# Patient Record
Sex: Female | Born: 1965 | Race: Asian | Hispanic: No | Marital: Married | Smoking: Never smoker
Health system: Southern US, Community
[De-identification: ages and names within clinical notes are randomized; demographics above are authoritative.]

## PROBLEM LIST (undated history)

## (undated) DIAGNOSIS — E785 Hyperlipidemia, unspecified: Secondary | ICD-10-CM

## (undated) DIAGNOSIS — G4730 Sleep apnea, unspecified: Secondary | ICD-10-CM

## (undated) DIAGNOSIS — K219 Gastro-esophageal reflux disease without esophagitis: Secondary | ICD-10-CM

## (undated) HISTORY — DX: Gastro-esophageal reflux disease without esophagitis: K21.9

## (undated) HISTORY — DX: Hyperlipidemia, unspecified: E78.5

## (undated) HISTORY — DX: Sleep apnea, unspecified: G47.30

---

## 2015-06-06 DIAGNOSIS — R0789 Other chest pain: Secondary | ICD-10-CM

## 2015-06-06 DIAGNOSIS — M545 Low back pain, unspecified: Secondary | ICD-10-CM | POA: Insufficient documentation

## 2015-06-06 DIAGNOSIS — E7800 Pure hypercholesterolemia, unspecified: Secondary | ICD-10-CM

## 2015-06-06 DIAGNOSIS — R635 Abnormal weight gain: Secondary | ICD-10-CM | POA: Insufficient documentation

## 2015-06-06 HISTORY — DX: Other chest pain: R07.89

## 2015-06-06 HISTORY — DX: Pure hypercholesterolemia, unspecified: E78.00

## 2015-06-06 HISTORY — DX: Low back pain, unspecified: M54.50

## 2015-06-06 HISTORY — DX: Abnormal weight gain: R63.5

## 2015-09-23 DIAGNOSIS — I10 Essential (primary) hypertension: Secondary | ICD-10-CM | POA: Insufficient documentation

## 2015-09-23 HISTORY — DX: Essential (primary) hypertension: I10

## 2017-05-28 DIAGNOSIS — I10 Essential (primary) hypertension: Secondary | ICD-10-CM

## 2017-05-28 HISTORY — DX: Essential (primary) hypertension: I10

## 2017-09-22 HISTORY — PX: TOTAL VAGINAL HYSTERECTOMY: SHX2548

## 2017-10-27 DIAGNOSIS — N83202 Unspecified ovarian cyst, left side: Secondary | ICD-10-CM

## 2017-10-27 DIAGNOSIS — Z9889 Other specified postprocedural states: Secondary | ICD-10-CM

## 2017-10-27 DIAGNOSIS — K635 Polyp of colon: Secondary | ICD-10-CM

## 2017-10-27 DIAGNOSIS — N83201 Unspecified ovarian cyst, right side: Secondary | ICD-10-CM

## 2017-10-27 DIAGNOSIS — G43909 Migraine, unspecified, not intractable, without status migrainosus: Secondary | ICD-10-CM | POA: Insufficient documentation

## 2017-10-27 HISTORY — DX: Migraine, unspecified, not intractable, without status migrainosus: G43.909

## 2017-10-27 HISTORY — DX: Polyp of colon: K63.5

## 2017-10-27 HISTORY — DX: Unspecified ovarian cyst, left side: N83.202

## 2017-10-27 HISTORY — DX: Other specified postprocedural states: Z98.890

## 2017-10-27 HISTORY — DX: Unspecified ovarian cyst, right side: N83.201

## 2017-12-08 LAB — HM COLONOSCOPY

## 2018-04-30 DIAGNOSIS — M25561 Pain in right knee: Secondary | ICD-10-CM | POA: Insufficient documentation

## 2018-04-30 HISTORY — DX: Pain in right knee: M25.561

## 2019-07-01 DIAGNOSIS — R7303 Prediabetes: Secondary | ICD-10-CM | POA: Insufficient documentation

## 2019-07-01 HISTORY — DX: Prediabetes: R73.03

## 2019-10-28 DIAGNOSIS — M542 Cervicalgia: Secondary | ICD-10-CM | POA: Insufficient documentation

## 2019-10-28 DIAGNOSIS — M180 Bilateral primary osteoarthritis of first carpometacarpal joints: Secondary | ICD-10-CM

## 2019-10-28 DIAGNOSIS — G5603 Carpal tunnel syndrome, bilateral upper limbs: Secondary | ICD-10-CM

## 2019-10-28 DIAGNOSIS — R200 Anesthesia of skin: Secondary | ICD-10-CM

## 2019-10-28 DIAGNOSIS — M65332 Trigger finger, left middle finger: Secondary | ICD-10-CM

## 2019-10-28 HISTORY — DX: Anesthesia of skin: R20.0

## 2019-10-28 HISTORY — DX: Trigger finger, left middle finger: M65.332

## 2019-10-28 HISTORY — DX: Cervicalgia: M54.2

## 2019-10-28 HISTORY — DX: Bilateral primary osteoarthritis of first carpometacarpal joints: M18.0

## 2019-10-28 HISTORY — DX: Carpal tunnel syndrome, bilateral upper limbs: G56.03

## 2019-11-26 ENCOUNTER — Ambulatory Visit: Payer: BLUE CROSS/BLUE SHIELD | Attending: Internal Medicine

## 2019-11-26 ENCOUNTER — Other Ambulatory Visit: Payer: Self-pay

## 2019-11-26 DIAGNOSIS — Z23 Encounter for immunization: Secondary | ICD-10-CM | POA: Insufficient documentation

## 2019-11-26 NOTE — Progress Notes (Signed)
   Covid-19 Vaccination Clinic  Name:  Warrene Kapfer    MRN: [id number] DOB: April 03, 1966  11/26/2019  Ms. Morine was observed post Covid-19 immunization for 15 minutes without incident. She was provided with Vaccine Information Sheet and instruction to access the V-Safe system.   Ms. Caserta was instructed to call 911 with any severe reactions post vaccine: Marland Kitchen Difficulty breathing  . Swelling of face and throat  . A fast heartbeat  . A bad rash all over body  . Dizziness and weakness   Immunizations Administered    Name Date Dose VIS Date Route   Moderna COVID-19 Vaccine 11/26/2019  4:08 PM 0.5 mL 08/23/2019 Intramuscular   Manufacturer: Moderna   Lot: [id number]   NDC: 98921-194-17

## 2019-12-24 ENCOUNTER — Ambulatory Visit: Payer: BLUE CROSS/BLUE SHIELD

## 2019-12-27 ENCOUNTER — Ambulatory Visit: Payer: BLUE CROSS/BLUE SHIELD | Attending: Family

## 2019-12-27 DIAGNOSIS — Z23 Encounter for immunization: Secondary | ICD-10-CM

## 2019-12-27 NOTE — Progress Notes (Signed)
   Covid-19 Vaccination Clinic  Name:  Sherri Wolf    MRN: [id number] DOB: Oct 23, 1965  12/27/2019  [name] was observed post Covid-19 immunization for 15 minutes without incident. She was provided with Vaccine Information Sheet and instruction to access the V-Safe system.   [name] was instructed to call 911 with any severe reactions post vaccine: Marland Kitchen Difficulty breathing  . Swelling of face and throat  . A fast heartbeat  . A bad rash all over body  . Dizziness and weakness   Immunizations Administered    Name Date Dose VIS Date Route   Moderna COVID-19 Vaccine 12/27/2019  4:11 PM 0.5 mL 08/23/2019 Intramuscular   Manufacturer: Moderna   Lot: [id number]   NDC: 38184-037-54

## 2019-12-30 DIAGNOSIS — E559 Vitamin D deficiency, unspecified: Secondary | ICD-10-CM | POA: Insufficient documentation

## 2019-12-30 HISTORY — DX: Vitamin D deficiency, unspecified: E55.9

## 2020-03-02 DIAGNOSIS — R053 Chronic cough: Secondary | ICD-10-CM

## 2020-03-02 HISTORY — DX: Chronic cough: R05.3

## 2020-09-08 DIAGNOSIS — J22 Unspecified acute lower respiratory infection: Secondary | ICD-10-CM

## 2020-09-08 DIAGNOSIS — J0100 Acute maxillary sinusitis, unspecified: Secondary | ICD-10-CM

## 2020-09-08 DIAGNOSIS — Z20822 Contact with and (suspected) exposure to covid-19: Secondary | ICD-10-CM | POA: Insufficient documentation

## 2020-09-08 HISTORY — DX: Acute maxillary sinusitis, unspecified: J01.00

## 2020-09-08 HISTORY — DX: Contact with and (suspected) exposure to covid-19: Z20.822

## 2020-09-08 HISTORY — DX: Unspecified acute lower respiratory infection: J22

## 2020-12-28 LAB — HM MAMMOGRAPHY

## 2021-01-07 DIAGNOSIS — R600 Localized edema: Secondary | ICD-10-CM

## 2021-01-07 DIAGNOSIS — R609 Edema, unspecified: Secondary | ICD-10-CM

## 2021-01-07 HISTORY — DX: Localized edema: R60.0

## 2021-12-04 ENCOUNTER — Encounter: Payer: Self-pay | Admitting: Family Medicine

## 2021-12-04 ENCOUNTER — Other Ambulatory Visit: Payer: Self-pay

## 2021-12-04 ENCOUNTER — Ambulatory Visit (INDEPENDENT_AMBULATORY_CARE_PROVIDER_SITE_OTHER): Payer: 59 | Admitting: Family Medicine

## 2021-12-04 VITALS — BP 114/78 | HR 83 | Temp 97.8°F | Ht 64.0 in | Wt 178.0 lb

## 2021-12-04 DIAGNOSIS — L299 Pruritus, unspecified: Secondary | ICD-10-CM

## 2021-12-04 DIAGNOSIS — E785 Hyperlipidemia, unspecified: Secondary | ICD-10-CM | POA: Insufficient documentation

## 2021-12-04 DIAGNOSIS — K219 Gastro-esophageal reflux disease without esophagitis: Secondary | ICD-10-CM | POA: Insufficient documentation

## 2021-12-04 DIAGNOSIS — E7800 Pure hypercholesterolemia, unspecified: Secondary | ICD-10-CM

## 2021-12-04 DIAGNOSIS — R600 Localized edema: Secondary | ICD-10-CM

## 2021-12-04 DIAGNOSIS — G4733 Obstructive sleep apnea (adult) (pediatric): Secondary | ICD-10-CM

## 2021-12-04 DIAGNOSIS — M797 Fibromyalgia: Secondary | ICD-10-CM

## 2021-12-04 HISTORY — DX: Localized edema: R60.0

## 2021-12-04 HISTORY — DX: Gastro-esophageal reflux disease without esophagitis: K21.9

## 2021-12-04 HISTORY — DX: Obstructive sleep apnea (adult) (pediatric): G47.33

## 2021-12-04 HISTORY — DX: Pruritus, unspecified: L29.9

## 2021-12-04 HISTORY — DX: Fibromyalgia: M79.7

## 2021-12-04 MED ORDER — TRIAMTERENE-HCTZ 37.5-25 MG PO TABS: 1.0000 | ORAL_TABLET | Freq: Every day | ORAL | 0 refills | Status: DC

## 2021-12-04 MED ORDER — DULOXETINE HCL 30 MG PO CPEP: 60.0000 mg | ORAL_CAPSULE | Freq: Every day | ORAL | 1 refills | Status: DC

## 2021-12-04 MED ORDER — HYDROXYZINE HCL 25 MG PO TABS: 25.0000 mg | ORAL_TABLET | ORAL | 1 refills | Status: DC | PRN

## 2021-12-04 MED ORDER — OMEPRAZOLE 40 MG PO CPDR: 40.0000 mg | DELAYED_RELEASE_CAPSULE | Freq: Every day | ORAL | 1 refills | Status: DC

## 2021-12-04 NOTE — Patient Instructions (Signed)
Welcome to [facility]! It was a pleasure meeting you today. ? ?As discussed, Please schedule a 1 month follow up visit today. ? ?Kegel exercises ? ?PLEASE NOTE: ? ?If you had any LAB tests please let us know if you have not heard back within a few days. You may see your results on MyChart before we have a chance to review them but we will give you a call once they are reviewed by [place]. If we ordered any REFERRALS today, please let us know if you have not heard from their office within the next week.  ?Let us know through MyChart if you are needing REFILLS, or have your pharmacy send [place] the request. You can also use MyChart to communicate with me or any office staff. ? ?Please try these tips to maintain a healthy lifestyle: ? ?Eat most of your calories during the day when you are active. Eliminate processed foods including packaged sweets (pies, cakes, cookies), reduce intake of potatoes, white bread, white pasta, and white rice. Look for whole grain options, oat flour or almond flour. ? ?Each meal should contain half fruits/vegetables, one quarter protein, and one quarter carbs (no bigger than a computer mouse). ? ?Cut down on sweet beverages. This includes juice, soda, and sweet tea. Also watch fruit intake, though this is a healthier sweet option, it still contains natural sugar! Limit to 3 servings daily. ? ?Drink at least 1 glass of water with each meal and aim for at least 8 glasses per day ? ?Exercise at least 150 minutes every week.   ?

## 2021-12-04 NOTE — Progress Notes (Signed)
? ?New Patient Office Visit ? ?Subjective:  ?Patient ID: Sherri Wolf, female    DOB: 01/24/1966  Age: 56 y.o. MRN: [id number] ? ?CC:  ?Chief Complaint  ?Patient presents with  ? Establish Care  ? ? ?HPI ?Sherri Wolf presents for new pt to est.  Moved from [place] in 2020. ? HLD-taking meds.  ? OSA-was wearing cpap but not past 1 yr as needs new supplies.  Not snoring.  Daytime fatiuge still.  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 Then other surgeries. ?GERD-gets gas/burning and HA.  Had EGD last yr.   Took omeprazole but ins not cover. Has constipation-taking colace. Belches  a lot ?Fibromyalgia-dx in [place]-on cymbalta 30mg  daily, occ 61-not well controlled. ? ?Past Medical History:  ?Diagnosis Date  ? Acid reflux   ? Hyperlipidemia   ? Hypertension 2017  ? resolved  ? Sleep apnea   ? used to wear cpap  ? ? ?Past Surgical History:  ?Procedure Laterality Date  ? TOTAL VAGINAL HYSTERECTOMY  2019  ? ? ?Family History  ?Problem Relation Age of Onset  ? Hypertension Mother   ? Hyperlipidemia Father   ? Hypertension Father   ? Lung disease Father   ? ? ?Social History  ? ?Socioeconomic History  ? Marital status: Married  ?  Spouse name: Not on file  ? Number of children: 0  ? Years of education: Not on file  ? Highest education level: Not on file  ?Occupational History  ? Not on file  ?Tobacco Use  ? Smoking status: Never  ? Smokeless tobacco: Never  ?Substance and Sexual Activity  ? Alcohol use: Never  ? Drug use: Not on file  ? Sexual activity: Yes  ?Other Topics Concern  ? Not on file  ?Social History Narrative  ? 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   ? Step son and dau.  Grands-2   ? ?Social Determinants of Health  ? ?Financial Resource Strain: Not on file  ?Food Insecurity: Not on file  ?Transportation Needs: Not on file  ?Physical Activity: Not on file  ?Stress: Not on  file  ?Social Connections: Not on file  ?Intimate Partner Violence: Not on file  ? ? ?ROS  ?ROS: ?Gen: no fever, chills  ?Skin: dry skin, itching all the time. ?ENT: no ear pain, ear drainage, nasal congestion, rhinorrhea, sinus pressure, sore throat ?Eyes: no blurry vision, double vision.  Bump R eye ?Resp: no cough, wheeze,SOB ?Breast: no breast tenderness, no nipple discharge, no breast masses ?CV: no CP, palpitations, LE edema,  ?GI: no heartburn, n/v/d/c, abd pain ?GU: no dysuria, urgency, frequency, hematuria.  Had some leaking so had "injection".  Does have urgency and stress incontinence. ?MSK: pain R knee-long time esp if stand long time.  Has seen ortho.  Has BCTS, thumb pain.  Pain joints and muscles.  Past 2 wks, harder to move neck.   ?Neuro: no dizziness, headache, weakness, vertigo ?Psych: + depression,  no anxiety, insomnia, no SI - pain is the issue. ? ?Objective:  ? ?Today's Vitals: BP 114/78   Pulse 83   Temp 97.8 ?F (36.6 ?C) (Oral)   Ht 5\' 4"  (1.626 m)   Wt 178 lb (80.7 kg)   SpO2 98%   BMI 30.55 kg/m?  ? ?Physical Exam  ?Gen: WDWN NAD OF ?HEENT: NCAT, conjunctiva not injected, sclera nonicteric. Small  nodule R upper lid ?NECK:  supple, no thyromegaly, no nodes, no carotid bruits ?CARDIAC: RRR, S1S2+, no murmur. DP 1+B ?LUNGS: CTAB. No wheezes ?ABDOMEN:  BS+, soft, NTND, No HSM, no masses ?EXT:  1+edema arms and legs ?MSK: no gross abnormalities.  ?NEURO: A&O x3.  CN II-XII intact.  ?PSYCH: normal mood. Good eye contact  ? ?Assessment & Plan:  ? ?Problem List Items Addressed This Visit   ? ?  ? Respiratory  ? Obstructive sleep apnea (adult) (pediatric)  ? Relevant Orders  ? Ambulatory referral to Neurology  ?  ? Digestive  ? Gastroesophageal reflux disease without esophagitis  ? Relevant Medications  ? omeprazole (PRILOSEC) 40 MG capsule  ?  ? Musculoskeletal and Integument  ? Pruritus  ? Relevant Orders  ? Ambulatory referral to Dermatology  ?  ? Other  ? Hyperlipidemia - Primary  ?  Relevant Medications  ? rosuvastatin (CRESTOR) 10 MG tablet  ? triamterene-hydrochlorothiazide (MAXZIDE-25) 37.5-25 MG tablet  ? Edema leg  ? Fibromyalgia  ? Relevant Medications  ? DULoxetine (CYMBALTA) 30 MG capsule  ? Hyperlipidemia-mixed.  On meds. Cont.  Will check labs next visit ?OSA-pt needs to know if even needs or needs supplies-will refer ?Edema-will trial maxide.  F/u next mo for progress/labs ?GERD-a lot of belching-restart PPI ?Fibromyalgia-take cymbalta 60mg  daily ?Pruritis-to Derm ? ?Outpatient Encounter Medications as of 12/04/2021  ?Medication Sig  ? Multiple Vitamins-Minerals (ONCOVITE) TABS Take 1 capsule by mouth daily.  ? omeprazole (PRILOSEC) 40 MG capsule Take 1 capsule (40 mg total) by mouth daily.  ? rosuvastatin (CRESTOR) 10 MG tablet Take 1 tablet by mouth daily.  ? triamterene-hydrochlorothiazide (MAXZIDE-25) 37.5-25 MG tablet Take 1 tablet by mouth daily.  ? [DISCONTINUED] DULoxetine (CYMBALTA) 30 MG capsule Take 60 mg by mouth.  ? [DISCONTINUED] hydrOXYzine (ATARAX) 25 MG tablet Take 1 tablet by mouth as needed.  ? DULoxetine (CYMBALTA) 30 MG capsule Take 2 capsules (60 mg total) by mouth daily.  ? hydrOXYzine (ATARAX) 25 MG tablet Take 1 tablet (25 mg total) by mouth as needed.  ? ?No facility-administered encounter medications on file as of 12/04/2021.  ? ? ?Follow-up: Return in about 4 weeks (around 01/01/2022) for fibro, edema.  ? ?03/03/2022, MD ?

## 2021-12-10 ENCOUNTER — Telehealth: Payer: Self-pay | Admitting: Physician Assistant

## 2021-12-10 NOTE — Telephone Encounter (Signed)
Referral attached to appointment

## 2021-12-10 NOTE — Telephone Encounter (Signed)
Please mark 07/23/22 appt w/[facility] as a referral from Dr. Ruthine Dose. ?

## 2021-12-29 ENCOUNTER — Other Ambulatory Visit: Payer: Self-pay | Admitting: Family Medicine

## 2021-12-31 ENCOUNTER — Ambulatory Visit (INDEPENDENT_AMBULATORY_CARE_PROVIDER_SITE_OTHER): Payer: 59 | Admitting: Family Medicine

## 2021-12-31 ENCOUNTER — Encounter: Payer: Self-pay | Admitting: Family Medicine

## 2021-12-31 VITALS — BP 104/60 | HR 59 | Temp 98.4°F | Ht 64.0 in | Wt 175.2 lb

## 2021-12-31 DIAGNOSIS — J069 Acute upper respiratory infection, unspecified: Secondary | ICD-10-CM

## 2021-12-31 DIAGNOSIS — Z789 Other specified health status: Secondary | ICD-10-CM

## 2021-12-31 DIAGNOSIS — R600 Localized edema: Secondary | ICD-10-CM | POA: Diagnosis not present

## 2021-12-31 DIAGNOSIS — K219 Gastro-esophageal reflux disease without esophagitis: Secondary | ICD-10-CM

## 2021-12-31 DIAGNOSIS — E782 Mixed hyperlipidemia: Secondary | ICD-10-CM

## 2021-12-31 DIAGNOSIS — E46 Unspecified protein-calorie malnutrition: Secondary | ICD-10-CM

## 2021-12-31 DIAGNOSIS — G4733 Obstructive sleep apnea (adult) (pediatric): Secondary | ICD-10-CM

## 2021-12-31 DIAGNOSIS — M797 Fibromyalgia: Secondary | ICD-10-CM

## 2021-12-31 DIAGNOSIS — M79606 Pain in leg, unspecified: Secondary | ICD-10-CM

## 2021-12-31 DIAGNOSIS — E669 Obesity, unspecified: Secondary | ICD-10-CM

## 2021-12-31 LAB — CBC WITH DIFFERENTIAL/PLATELET
Basophils Absolute: 0.1 10*3/uL (ref 0.0–0.1)
Basophils Relative: 0.6 % (ref 0.0–3.0)
Eosinophils Absolute: 0.3 10*3/uL (ref 0.0–0.7)
Eosinophils Relative: 2.3 % (ref 0.0–5.0)
HCT: 37.7 % (ref 36.0–46.0)
Hemoglobin: 12.4 g/dL (ref 12.0–15.0)
Lymphocytes Relative: 16.4 % (ref 12.0–46.0)
Lymphs Abs: 1.9 10*3/uL (ref 0.7–4.0)
MCHC: 33 g/dL (ref 30.0–36.0)
MCV: 82.1 fl (ref 78.0–100.0)
Monocytes Absolute: 0.8 10*3/uL (ref 0.1–1.0)
Monocytes Relative: 6.9 % (ref 3.0–12.0)
Neutro Abs: 8.7 10*3/uL — ABNORMAL HIGH (ref 1.4–7.7)
Neutrophils Relative %: 73.8 % (ref 43.0–77.0)
Platelets: 352 10*3/uL (ref 150.0–400.0)
RBC: 4.58 Mil/uL (ref 3.87–5.11)
RDW: 13.6 % (ref 11.5–15.5)
WBC: 11.8 10*3/uL — ABNORMAL HIGH (ref 4.0–10.5)

## 2021-12-31 LAB — COMPREHENSIVE METABOLIC PANEL
ALT: 14 U/L (ref 0–35)
AST: 15 U/L (ref 0–37)
Albumin: 4.3 g/dL (ref 3.5–5.2)
Alkaline Phosphatase: 94 U/L (ref 39–117)
BUN: 8 mg/dL (ref 6–23)
CO2: 29 mEq/L (ref 19–32)
Calcium: 9.7 mg/dL (ref 8.4–10.5)
Chloride: 104 mEq/L (ref 96–112)
Creatinine, Ser: 0.81 mg/dL (ref 0.40–1.20)
GFR: 81.94 mL/min (ref >60.00)
Glucose, Bld: 105 mg/dL — ABNORMAL HIGH (ref 70–99)
Potassium: 4.2 mEq/L (ref 3.5–5.1)
Sodium: 143 mEq/L (ref 135–145)
Total Bilirubin: 0.6 mg/dL (ref 0.2–1.2)
Total Protein: 7.5 g/dL (ref 6.0–8.3)

## 2021-12-31 LAB — MAGNESIUM: Magnesium: 2 mg/dL (ref 1.5–2.5)

## 2021-12-31 LAB — LIPID PANEL
Cholesterol: 317 mg/dL — ABNORMAL HIGH (ref 0–200)
HDL: 58.8 mg/dL (ref >39.00)
LDL Cholesterol: 221 mg/dL — ABNORMAL HIGH (ref 0–99)
NonHDL: 258.28
Total CHOL/HDL Ratio: 5
Triglycerides: 187 mg/dL — ABNORMAL HIGH (ref 0.0–149.0)
VLDL: 37.4 mg/dL (ref 0.0–40.0)

## 2021-12-31 LAB — POC COVID19 BINAXNOW: SARS Coronavirus 2 Ag: NEGATIVE

## 2021-12-31 LAB — VITAMIN B12: Vitamin B-12: 253 pg/mL (ref 211–911)

## 2021-12-31 LAB — TSH: TSH: 2.1 u[IU]/mL (ref 0.35–5.50)

## 2021-12-31 MED ORDER — DULOXETINE HCL 30 MG PO CPEP: ORAL_CAPSULE | ORAL | 1 refills | Status: DC

## 2021-12-31 MED ORDER — POTASSIUM CHLORIDE CRYS ER 20 MEQ PO TBCR: 20.0000 meq | EXTENDED_RELEASE_TABLET | Freq: Every day | ORAL | 3 refills | Status: DC

## 2021-12-31 MED ORDER — METHOCARBAMOL 500 MG PO TABS: 500.0000 mg | ORAL_TABLET | Freq: Three times a day (TID) | ORAL | 3 refills | Status: DC | PRN

## 2021-12-31 MED ORDER — FUROSEMIDE 20 MG PO TABS: 20.0000 mg | ORAL_TABLET | Freq: Every day | ORAL | 3 refills | Status: DC

## 2021-12-31 NOTE — Progress Notes (Signed)
? ?Subjective:  ? ? ? Patient ID: Sherri Wolf, female    DOB: 05/13/1966, 56 y.o.   MRN: [id number] ? ?Chief Complaint  ?Patient presents with  ? Follow-up  ? Sore Throat  ?  Pt c/o sore throat and chills last night.  ? Leg Pain  ?  Pt c/o bilateral leg pain with the right pain being the worse.  ? ? ?HPI ? OSA-went to Pulm at [facility]-ins not cover them so no f/u,  but then new ins-needs to check w/them if on plan to f/u cpap per our referral. ?Edema-maxide helps some but if standing, more swelling and pain. Tried compression stockings but too much pain.  Had doppler/ABI-no clots and "blood flow testing all normal".  03/13/21-stress mibi-normal.  EF 77%.  03/06/21-echo normal ?B leg pain-bad. Not allowed to sit at work so 4 hrs of standing.  After 1 hr, pain R back of thigh to knee to front of lower leg.   Can't do treadmill as back pain worse.  Cycling ok. Has never seen anyone about neck and back pain.  No PT.   ?Fibromyalgia-Duloxetine 60mg  helps some but still a lot of pain  meloxicam not help.   Flexeril not help at all. Pred not help-saw rheum once.  Was supposed to f/u but. No f/u d/t doc emergency and then insurance issues, etc, ?5.  GERD-better on meds.  Worse if spicey food. Still belching a lot.  03/03/21 had normal gastric emptying study.  EGD 01/11/21 small HH, 53mm sessile polyp lower 3rd esoph, mild erythema ?6.  2017-HA so MRI brain "white matter" problem.  Pt inquiring what that means-she will bring report ?7.  Itching-Derm - not till Nov 1. ?8.  Vegetarian-wants to see nutritionist.  ?9.  Sore throat/chills last pm.  Cough/congestion.  Felt feverish.  ? ?Health Maintenance Due  ?Topic Date Due  ? HIV Screening  Never done  ? Hepatitis C Screening  Never done  ? Zoster Vaccines- Shingrix (1 of 2) Never done  ? ? ?Past Medical History:  ?Diagnosis Date  ? Acid reflux   ? Hyperlipidemia   ? Hypertension 2017  ? resolved  ? Sleep apnea   ? used to wear cpap  ? ? ?Past Surgical History:  ?Procedure Laterality Date   ? TOTAL VAGINAL HYSTERECTOMY  2019  ? ? ?Outpatient Medications Prior to Visit  ?Medication Sig Dispense Refill  ? hydrOXYzine (ATARAX) 25 MG tablet Take 1 tablet (25 mg total) by mouth as needed. 90 tablet 1  ? Multiple Vitamins-Minerals (ONCOVITE) TABS Take 1 capsule by mouth daily.    ? omeprazole (PRILOSEC) 40 MG capsule Take 1 capsule (40 mg total) by mouth daily. 90 capsule 1  ? rosuvastatin (CRESTOR) 10 MG tablet Take 1 tablet by mouth daily.    ? DULoxetine (CYMBALTA) 30 MG capsule Take 2 capsules (60 mg total) by mouth daily. 90 capsule 1  ? triamterene-hydrochlorothiazide (MAXZIDE-25) 37.5-25 MG tablet Take 1 tablet by mouth daily. 90 tablet 0  ? ?No facility-administered medications prior to visit.  ? ? ?Not on File ?ROS neg/noncontributory except as noted HPI/below ? ? ?   ?Objective:  ?  ? ?BP 104/60   Pulse (!) 59   Temp 98.4 ?F (36.9 ?C)   Ht 5\' 4"  (1.626 m)   Wt 175 lb 3.2 oz (79.5 kg)   SpO2 96%   BMI 30.07 kg/m?  ?Wt Readings from Last 3 Encounters:  ?12/31/21 175 lb 3.2 oz (79.5 kg)  ?  12/04/21 178 lb (80.7 kg)  ? ? ?Physical Exam  ? ?Gen: WDWN NAD ?HEENT: NCAT, conjunctiva not injected, sclera nonicteric ?TM WNL B, OP moist, no exudates  sl red.  Pt congested ?NECK:  supple, no thyromegaly, no nodes, no carotid bruits ?CARDIAC: RRR, S1S2+, no murmur. DP 1+B ?LUNGS: CTAB. No wheezes ?ABDOMEN:  BS+, soft, mildly diffusely tender, No HSM, no masses.  Belching some ?EXT:  1+ edema ?MSK: no gross abnormalities.  ?NEURO: A&O x3.  CN II-XII intact.  ?PSYCH: normal mood. Good eye contact ? ?Results for orders placed or performed in visit on 12/31/21  ?CBC with Differential/Platelet  ?Result Value Ref Range  ? WBC 11.8 (H) 4.0 - 10.5 K/uL  ? RBC 4.58 3.87 - 5.11 Mil/uL  ? Hemoglobin 12.4 12.0 - 15.0 g/dL  ? HCT 37.7 36.0 - 46.0 %  ? MCV 82.1 78.0 - 100.0 fl  ? MCHC 33.0 30.0 - 36.0 g/dL  ? RDW 13.6 11.5 - 15.5 %  ? Platelets 352.0 150.0 - 400.0 K/uL  ? Neutrophils Relative % 73.8 43.0 - 77.0 %  ?  Lymphocytes Relative 16.4 12.0 - 46.0 %  ? Monocytes Relative 6.9 3.0 - 12.0 %  ? Eosinophils Relative 2.3 0.0 - 5.0 %  ? Basophils Relative 0.6 0.0 - 3.0 %  ? Neutro Abs 8.7 (H) 1.4 - 7.7 K/uL  ? Lymphs Abs 1.9 0.7 - 4.0 K/uL  ? Monocytes Absolute 0.8 0.1 - 1.0 K/uL  ? Eosinophils Absolute 0.3 0.0 - 0.7 K/uL  ? Basophils Absolute 0.1 0.0 - 0.1 K/uL  ?Comprehensive metabolic panel  ?Result Value Ref Range  ? Sodium 143 135 - 145 mEq/L  ? Potassium 4.2 3.5 - 5.1 mEq/L  ? Chloride 104 96 - 112 mEq/L  ? CO2 29 19 - 32 mEq/L  ? Glucose, Bld 105 (H) 70 - 99 mg/dL  ? BUN 8 6 - 23 mg/dL  ? Creatinine, Ser 0.81 0.40 - 1.20 mg/dL  ? Total Bilirubin 0.6 0.2 - 1.2 mg/dL  ? Alkaline Phosphatase 94 39 - 117 U/L  ? AST 15 0 - 37 U/L  ? ALT 14 0 - 35 U/L  ? Total Protein 7.5 6.0 - 8.3 g/dL  ? Albumin 4.3 3.5 - 5.2 g/dL  ? GFR 81.94 >60.00 mL/min  ? Calcium 9.7 8.4 - 10.5 mg/dL  ?Lipid panel  ?Result Value Ref Range  ? Cholesterol 317 (H) 0 - 200 mg/dL  ? Triglycerides 187.0 (H) 0.0 - 149.0 mg/dL  ? HDL 58.80 >39.00 mg/dL  ? VLDL 37.4 0.0 - 40.0 mg/dL  ? LDL Cholesterol 221 (H) 0 - 99 mg/dL  ? Total CHOL/HDL Ratio 5   ? NonHDL 258.28   ?TSH  ?Result Value Ref Range  ? TSH 2.10 0.35 - 5.50 uIU/mL  ?Magnesium  ?Result Value Ref Range  ? Magnesium 2.0 1.5 - 2.5 mg/dL  ?Vitamin B12  ?Result Value Ref Range  ? Vitamin B-12 253 211 - 911 pg/mL  ?POC COVID-19  ?Result Value Ref Range  ? SARS Coronavirus 2 Ag Negative Negative  ?  ? ?   ?Assessment & Plan:  ? ?Problem List Items Addressed This Visit   ? ?  ? Respiratory  ? Obstructive sleep apnea (adult) (pediatric)  ?  ? Digestive  ? Gastroesophageal reflux disease without esophagitis  ? Relevant Orders  ? Vitamin B12 (Completed)  ?  ? Other  ? Hyperlipidemia  ? Relevant Medications  ? furosemide (LASIX) 20 MG  tablet  ? Other Relevant Orders  ? Comprehensive metabolic panel (Completed)  ? Lipid panel (Completed)  ? Edema leg  ? Relevant Orders  ? CBC with Differential/Platelet (Completed)   ? Comprehensive metabolic panel (Completed)  ? TSH (Completed)  ? Magnesium (Completed)  ? Fibromyalgia - Primary  ? Relevant Medications  ? DULoxetine (CYMBALTA) 30 MG capsule  ? methocarbamol (ROBAXIN) 500 MG tablet  ? ?Other Visit Diagnoses   ? ? Viral upper respiratory tract infection      ? Relevant Orders  ? POC COVID-19 (Completed)  ? Leg pain, diffuse, unspecified laterality      ? Vegetarian      ? ?  ?1.  Obstructive sleep apnea-advised that  referral coordinator recommended follow-up with pulmonology that she is already established with.  She will check insurance to make sure they are covered. ?2.  Edema-chronic.  Not well controlled on Maxide.  Will do a trial of Lasix with potassium.  Check CBC, CMP, TSH.  After patient left, did extensive review of old records.  She has had a complete work-up for this.  Will see if Lasix helps. ?3.  Fibromyalgia-chronic.  Not well controlled.  She did see some benefit from increasing Cymbalta.  Will increase to 30 mg in the morning and 60 mg in the evening.  If she continues to see benefit, then increase to 60 mg twice daily.  Flexeril did not help.  Will trial methocarbamol. ?4.  Bilateral leg pain-suspect some degree of sciatica.  In my review of her medical records, I found that she does have some osteoarthritis.  Patient has a lot of pain problems including cervicalgia, back pain, leg pain, arm pain.  Will refer to rheumatology and sports medicine. ?5.  Hyperlipidemia-chronic.  On Crestor.  Trying to work some on diet.  Check CMP, lipids ?6.  GERD-better control on omeprazole daily.  Still having some problems and belching.  After patient left, this has been thoroughly worked up.  Continue medications.  Check B12 due to PPI ?7.  Upper respiratory infection-COVID was negative.  Suspect viral.  Symptomatic treatment. ?8.  Vegetarian-patient is requesting referral to nutritionist to help assist her with proper diet.   ? ?I spent 45 minutes face-to-face with the  patient getting updates on her history, new complaints, seeing her medication changes were doing, ordering labs, changing medications.  I then spent an additional 20 minutes reviewing old records after the patient

## 2021-12-31 NOTE — Patient Instructions (Addendum)
It was very nice to see you today! ? ?[facility] call for mammogram ? ?[facility]-for respiratory. ? ? ?PLEASE NOTE: ? ?If you had any lab tests please let us know if you have not heard back within a few days. You may see your results on MyChart before we have a chance to review them but we will give you a call once they are reviewed by [place]. If we ordered any referrals today, please let us know if you have not heard from their office within the next week.  ? ?Please try these tips to maintain a healthy lifestyle: ? ?Eat most of your calories during the day when you are active. Eliminate processed foods including packaged sweets (pies, cakes, cookies), reduce intake of potatoes, white bread, white pasta, and white rice. Look for whole grain options, oat flour or almond flour. ? ?Each meal should contain half fruits/vegetables, one quarter protein, and one quarter carbs (no bigger than a computer mouse). ? ?Cut down on sweet beverages. This includes juice, soda, and sweet tea. Also watch fruit intake, though this is a healthier sweet option, it still contains natural sugar! Limit to 3 servings daily. ? ?Drink at least 1 glass of water with each meal and aim for at least 8 glasses per day ? ?Exercise at least 150 minutes every week.   ?

## 2022-01-01 NOTE — Addendum Note (Signed)
Addended by: Angelena Sole on: 01/01/2022 09:36 AM ? ? Modules accepted: Orders ? ?

## 2022-01-06 NOTE — Telephone Encounter (Signed)
Patient stated that she may take medication twice a week if needed.  ?

## 2022-01-06 NOTE — Progress Notes (Deleted)
    Sherri Wolf D.[facility] [address] [place] [address] Phone: [phone]   Assessment and Plan:     There are no diagnoses linked to this encounter.  ***   Pertinent previous records reviewed include ***   Follow Up: ***     Subjective:   I, Sherri Wolf, am serving as a Neurosurgeon for Doctor Sherri Wolf  Chief Complaint: leg pain   HPI:  01/07/2022 Patient is a 56 year old female complaining of leg pain. Patient stats  Relevant Historical Information: ***  Additional pertinent review of systems negative.   Current Outpatient Medications:    DULoxetine (CYMBALTA) 30 MG capsule, Take 2 capsules (60 mg total) by mouth daily AND 1 capsule (30 mg total) daily., Disp: 270 capsule, Rfl: 1   furosemide (LASIX) 20 MG tablet, Take 1 tablet (20 mg total) by mouth daily., Disp: 30 tablet, Rfl: 3   hydrOXYzine (ATARAX) 25 MG tablet, Take 1 tablet (25 mg total) by mouth as needed., Disp: 90 tablet, Rfl: 1   methocarbamol (ROBAXIN) 500 MG tablet, Take 1 tablet (500 mg total) by mouth every 8 (eight) hours as needed for muscle spasms., Disp: 30 tablet, Rfl: 3   Multiple Vitamins-Minerals (ONCOVITE) TABS, Take 1 capsule by mouth daily., Disp: , Rfl:    omeprazole (PRILOSEC) 40 MG capsule, Take 1 capsule (40 mg total) by mouth daily., Disp: 90 capsule, Rfl: 1   potassium chloride SA (KLOR-CON M) 20 MEQ tablet, Take 1 tablet (20 mEq total) by mouth daily., Disp: 30 tablet, Rfl: 3   rosuvastatin (CRESTOR) 10 MG tablet, Take 1 tablet by mouth daily., Disp: , Rfl:    Objective:     There were no vitals filed for this visit.    There is no height or weight on file to calculate BMI.    Physical Exam:    ***   Electronically signed by:  Sherri Wolf D.[facility] 10:37 AM 01/06/22

## 2022-01-07 ENCOUNTER — Ambulatory Visit (INDEPENDENT_AMBULATORY_CARE_PROVIDER_SITE_OTHER): Payer: 59 | Admitting: Physician Assistant

## 2022-01-07 ENCOUNTER — Ambulatory Visit: Payer: 59 | Admitting: Sports Medicine

## 2022-01-07 VITALS — BP 138/82 | HR 80 | Temp 98.5°F | Ht 64.0 in | Wt 175.6 lb

## 2022-01-07 DIAGNOSIS — R051 Acute cough: Secondary | ICD-10-CM

## 2022-01-07 MED ORDER — PREDNISONE 20 MG PO TABS: 20.0000 mg | ORAL_TABLET | Freq: Every day | ORAL | 0 refills | Status: DC

## 2022-01-07 MED ORDER — AMOXICILLIN-POT CLAVULANATE 875-125 MG PO TABS: 1.0000 | ORAL_TABLET | Freq: Two times a day (BID) | ORAL | 0 refills | Status: DC

## 2022-01-07 NOTE — Progress Notes (Signed)
Sherri Wolf is a 56 y.o. female here for nasal congestion. ? ?History of Present Illness:  ? ?Chief Complaint  ?Patient presents with  ? Nasal Congestion  ?  Pt c/o congestion and cold symptoms; head pain with coughing and sneeze, ear and throat pain. Pt also c/o back pain; pt was tested for Covid 4/11 and negative; pt states no improvement of symptoms and getting worse.   ? ? ?HPI ? ?Cough ?Sherri Wolf presents with c/o nasal congestion that has been onset for about a week. States that she has also been experiencing ear/throat pain, head pressure when sneezing/coughing, and body aches. Pt did see Dr. Cherlynn Kaiser, PCP on 12/31/21, but stated this was when sx were first gradually appearing. At the time of that visit, per Dr. Kathrin Ruddy note pt was suspected to have a viral URI which didn't call for medication. Despite this, pt has tried taking OTC mucinex due to ongoing sx but this hasn't provided any relief. Denies fever but she is having body aches and chills. ? ?Past Medical History:  ?Diagnosis Date  ? Acid reflux   ? Hyperlipidemia   ? Hypertension 2017  ? resolved  ? Sleep apnea   ? used to wear cpap  ? ?  ?Social History  ? ?Tobacco Use  ? Smoking status: Never  ? Smokeless tobacco: Never  ?Substance Use Topics  ? Alcohol use: Never  ? ? ?Past Surgical History:  ?Procedure Laterality Date  ? TOTAL VAGINAL HYSTERECTOMY  2019  ? ? ?Family History  ?Problem Relation Age of Onset  ? Hypertension Mother   ? Hyperlipidemia Father   ? Hypertension Father   ? Lung disease Father   ? ? ?Not on File ? ?Current Medications:  ? ?Current Outpatient Medications:  ?  DULoxetine (CYMBALTA) 30 MG capsule, Take 2 capsules (60 mg total) by mouth daily AND 1 capsule (30 mg total) daily., Disp: 270 capsule, Rfl: 1 ?  furosemide (LASIX) 20 MG tablet, Take 1 tablet (20 mg total) by mouth daily., Disp: 30 tablet, Rfl: 3 ?  hydrOXYzine (ATARAX) 25 MG tablet, Take 1 tablet (25 mg total) by mouth as needed., Disp: 90 tablet, Rfl: 1 ?  methocarbamol  (ROBAXIN) 500 MG tablet, Take 1 tablet (500 mg total) by mouth every 8 (eight) hours as needed for muscle spasms., Disp: 30 tablet, Rfl: 3 ?  Multiple Vitamins-Minerals (ONCOVITE) TABS, Take 1 capsule by mouth daily., Disp: , Rfl:  ?  omeprazole (PRILOSEC) 40 MG capsule, Take 1 capsule (40 mg total) by mouth daily., Disp: 90 capsule, Rfl: 1 ?  potassium chloride SA (KLOR-CON M) 20 MEQ tablet, Take 1 tablet (20 mEq total) by mouth daily., Disp: 30 tablet, Rfl: 3 ?  rosuvastatin (CRESTOR) 10 MG tablet, Take 1 tablet by mouth daily., Disp: , Rfl:   ? ?Review of Systems:  ? ?ROS ?Negative unless otherwise specified per HPI. ?Vitals:  ? ?Vitals:  ? 01/07/22 1508  ?BP: 138/82  ?Pulse: 80  ?Temp: 98.5 ?F (36.9 ?C)  ?TempSrc: Temporal  ?SpO2: 98%  ?Weight: 175 lb 9.6 oz (79.7 kg)  ?Height: 5\' 4"  (1.626 m)  ?   ?Body mass index is 30.14 kg/m?. ? ?Physical Exam:  ? ?Physical Exam ?Vitals and nursing note reviewed.  ?Constitutional:   ?   General: She is not in acute distress. ?   Appearance: She is well-developed. She is not ill-appearing or toxic-appearing.  ?HENT:  ?   Head: Normocephalic and atraumatic.  ?   Right Ear:  Tympanic membrane, ear canal and external ear normal. Tympanic membrane is not erythematous, retracted or bulging.  ?   Left Ear: Tympanic membrane, ear canal and external ear normal. Tympanic membrane is not erythematous, retracted or bulging.  ?   Nose: Nose normal.  ?   Right Sinus: No maxillary sinus tenderness or frontal sinus tenderness.  ?   Left Sinus: No maxillary sinus tenderness or frontal sinus tenderness.  ?   Mouth/Throat:  ?   Pharynx: Uvula midline. Pharyngeal swelling present.  ?Eyes:  ?   General: Lids are normal.  ?   Conjunctiva/sclera: Conjunctivae normal.  ?Neck:  ?   Trachea: Trachea normal.  ?Cardiovascular:  ?   Rate and Rhythm: Normal rate and regular rhythm.  ?   Pulses: Normal pulses.  ?   Heart sounds: Normal heart sounds, S1 normal and S2 normal.  ?Pulmonary:  ?   Effort:  Pulmonary effort is normal.  ?   Breath sounds: Normal breath sounds. No decreased breath sounds, wheezing, rhonchi or rales.  ?Lymphadenopathy:  ?   Cervical: No cervical adenopathy.  ?Skin: ?   General: Skin is warm and dry.  ?Neurological:  ?   Mental Status: She is alert.  ?   GCS: GCS eye subscore is 4. GCS verbal subscore is 5. GCS motor subscore is 6.  ?Psychiatric:     ?   Speech: Speech normal.     ?   Behavior: Behavior normal. Behavior is cooperative.  ? ? ?Assessment and Plan:  ? ?Cough ?No red flags  ?Suspect sinusitis ?Start augmentin 875-125 mg twice daily x 5 days and prednisone 20 mg daily x 5 days ?Encouraged patient to push more fluids and rest  ?Follow up with PCP if new/worsening symptoms or concerns occur  ? ?I,Havlyn C Ratchford,acting as a scribe for [employer], PA.,have documented all relevant documentation on the behalf of Sherri Coke, PA,as directed by  Sherri Coke, PA while in the presence of Sherri Wolf, [place]. ? ?IInda Coke, PA, have reviewed all documentation for this visit. The documentation on 01/07/22 for the exam, diagnosis, procedures, and orders are all accurate and complete. ? ? ?Sherri Coke, PA-C ? ?

## 2022-01-07 NOTE — Patient Instructions (Signed)
It was great to see you! ? ?Use medication as prescribed: augmentin and prednisone ? ?Push fluids and get plenty of rest. ?Please return if you are not improving as expected, or if you have high fevers (>101.5) or difficulty swallowing or worsening productive cough. ? ?Call clinic with questions. ? ?I hope you start feeling better soon!  ? ?Take care, ? ?Jarold Motto PA-C  ?

## 2022-01-13 ENCOUNTER — Encounter: Payer: Self-pay | Admitting: Family Medicine

## 2022-01-13 NOTE — Progress Notes (Signed)
? ? Sherri Wolf D.Judd Gaudier ?[facility] ?[address] [place] [address] ?Phone: [phone] ?  ?Assessment and Plan:   ?  ?1. Fibromyalgia ?2. Polyarthralgia ?3. Positive ANA (antinuclear antibody) ?4. Neck pain ?5. Chronic bilateral thoracic back pain ?6. Chronic bilateral low back pain without sciatica ?7. Chronic pain of right knee ?8. Chronic pain of right ankle ?9. Bilateral hip pain ?-Chronic with exacerbation, initial sports medicine visit ?- Multiple musculoskeletal complaints with history of positive ANA and no recent rheumatology visit for work-up ?- I believe that most appropriate treatment for patient can be provided by rheumatology at this time.  We will refer to rheumatology for further work-up and treatment ?- We will obtain general lab work at today's visit to further evaluate for rheumatologic/autoimmune condition ?- Evaluated multiple areas of chronic pain with x-rays at today's visit.  My interpretation: No acute fracture or dislocation.  No significant scoliosis or listhesis.  Overall unremarkable images of C-spine, T-spine, L-spine, hips, pelvis, knee, ankle ?- Continue duloxetine 60 mg nightly prescribed by PCP ?- May use Tylenol/NSAIDs as needed for pain relief ?  ?Pertinent previous records reviewed include lab work 12/31/21, lab work 12/30/2019 ?  ?Follow Up: As needed ?  ?Subjective:   ?I, [name], am serving as a Neurosurgeon for [employer] ? ?Chief Complaint: leg pain  ? ?HPI:  ? ?01/14/2022 ?Patient is a 56 year old female complaining of leg pain. Patient states right leg pain with left leg pain due to compensation, has lower leg swelling with radiating pain up the whole leg and some low back pain, started 3-4 years ago, started getting really bad 6 months ago due to her job where she stands a lot, she fell in 2006 , does get numbness tingling in the hands and fingers as well as the legs, has been taking ib and tylenol it all depends on her  pain , only relieves pain for 2-3 hours, does get clicking and popping does cramp a lot at night is getting bruising and skin is very dry, is also complaining of neck tightness with decreased ROM it feels tight and painful  ? ?Relevant Historical Information: Positive ANA, fibromyalgia, hypertension ? ?Additional pertinent review of systems negative. ? ? ?Current Outpatient Medications:  ?  amoxicillin-clavulanate (AUGMENTIN) 875-125 MG tablet, Take 1 tablet by mouth 2 (two) times daily., Disp: 10 tablet, Rfl: 0 ?  DULoxetine (CYMBALTA) 30 MG capsule, Take 2 capsules (60 mg total) by mouth daily AND 1 capsule (30 mg total) daily., Disp: 270 capsule, Rfl: 1 ?  furosemide (LASIX) 20 MG tablet, Take 1 tablet (20 mg total) by mouth daily., Disp: 30 tablet, Rfl: 3 ?  hydrOXYzine (ATARAX) 25 MG tablet, Take 1 tablet (25 mg total) by mouth as needed., Disp: 90 tablet, Rfl: 1 ?  methocarbamol (ROBAXIN) 500 MG tablet, Take 1 tablet (500 mg total) by mouth every 8 (eight) hours as needed for muscle spasms., Disp: 30 tablet, Rfl: 3 ?  Multiple Vitamins-Minerals (ONCOVITE) TABS, Take 1 capsule by mouth daily., Disp: , Rfl:  ?  omeprazole (PRILOSEC) 40 MG capsule, Take 1 capsule (40 mg total) by mouth daily., Disp: 90 capsule, Rfl: 1 ?  potassium chloride SA (KLOR-CON M) 20 MEQ tablet, Take 1 tablet (20 mEq total) by mouth daily., Disp: 30 tablet, Rfl: 3 ?  predniSONE (DELTASONE) 20 MG tablet, Take 1 tablet (20 mg total) by mouth daily with breakfast., Disp: 5 tablet, Rfl: 0 ?  rosuvastatin (  CRESTOR) 10 MG tablet, Take 1 tablet by mouth daily., Disp: , Rfl:   ? ?Objective:   ?  ?Vitals:  ? 01/14/22 1010  ?BP: 120/82  ?Pulse: 84  ?SpO2: 99%  ?Weight: 178 lb (80.7 kg)  ?Height: 5\' 4"  (1.626 m)  ?  ?  ?Body mass index is 30.55 kg/m?.  ?  ?Physical Exam:   ? ?General: Well-appearing, cooperative, sitting comfortably in no acute distress.  ?HEENT: Normocephalic, atraumatic.   ?Neck: No gross abnormality.  ?Cardiovascular: No pallor  or cyanosis. ?Resp: Comfortable WOB.   ?Abdomen: Non distended.   ?Skin: Warm and dry; no focal rashes identified on limited exam. ?Extremities: No cyanosis.  Mild generalized edema to bilateral lower extremities ?Neuro: Gross motor and sensory intact. Gait normal. ?Psychiatric: Mood and affect are appropriate.   ?Neuro: CN II-XII intact; strength and sensation intact generally, reflexes 2+ in upper and lower extremities   ?Musculoskeletal: TTP right knee, right ankle, cervical/thoracic/lumbar paraspinal, hip flexors, gluteal musculature, greater trochanter bilaterally ? ?Electronically signed by:  ? D.Sherri Wolf ?[facility] ?11:09 AM 01/14/22 ?

## 2022-01-14 ENCOUNTER — Ambulatory Visit: Payer: 59 | Admitting: Sports Medicine

## 2022-01-14 ENCOUNTER — Ambulatory Visit (INDEPENDENT_AMBULATORY_CARE_PROVIDER_SITE_OTHER): Payer: 59

## 2022-01-14 VITALS — BP 120/82 | HR 84 | Ht 64.0 in | Wt 178.0 lb

## 2022-01-14 DIAGNOSIS — R768 Other specified abnormal immunological findings in serum: Secondary | ICD-10-CM

## 2022-01-14 DIAGNOSIS — M797 Fibromyalgia: Secondary | ICD-10-CM | POA: Diagnosis not present

## 2022-01-14 DIAGNOSIS — M542 Cervicalgia: Secondary | ICD-10-CM | POA: Diagnosis not present

## 2022-01-14 DIAGNOSIS — G8929 Other chronic pain: Secondary | ICD-10-CM

## 2022-01-14 DIAGNOSIS — M25552 Pain in left hip: Secondary | ICD-10-CM

## 2022-01-14 DIAGNOSIS — M2550 Pain in unspecified joint: Secondary | ICD-10-CM

## 2022-01-14 DIAGNOSIS — M545 Low back pain, unspecified: Secondary | ICD-10-CM

## 2022-01-14 DIAGNOSIS — M25551 Pain in right hip: Secondary | ICD-10-CM

## 2022-01-14 DIAGNOSIS — M25561 Pain in right knee: Secondary | ICD-10-CM

## 2022-01-14 DIAGNOSIS — M546 Pain in thoracic spine: Secondary | ICD-10-CM

## 2022-01-14 DIAGNOSIS — M25571 Pain in right ankle and joints of right foot: Secondary | ICD-10-CM

## 2022-01-14 LAB — COMPREHENSIVE METABOLIC PANEL
ALT: 20 U/L (ref 0–35)
AST: 18 U/L (ref 0–37)
Albumin: 3.7 g/dL (ref 3.5–5.2)
Alkaline Phosphatase: 84 U/L (ref 39–117)
BUN: 12 mg/dL (ref 6–23)
CO2: 29 mEq/L (ref 19–32)
Calcium: 9 mg/dL (ref 8.4–10.5)
Chloride: 101 mEq/L (ref 96–112)
Creatinine, Ser: 0.69 mg/dL (ref 0.40–1.20)
GFR: 97.94 mL/min (ref >60.00)
Glucose, Bld: 77 mg/dL (ref 70–99)
Potassium: 4.2 mEq/L (ref 3.5–5.1)
Sodium: 136 mEq/L (ref 135–145)
Total Bilirubin: 0.3 mg/dL (ref 0.2–1.2)
Total Protein: 7.5 g/dL (ref 6.0–8.3)

## 2022-01-14 LAB — CBC WITH DIFFERENTIAL/PLATELET
Basophils Absolute: 0.1 10*3/uL (ref 0.0–0.1)
Basophils Relative: 0.8 % (ref 0.0–3.0)
Eosinophils Absolute: 0.4 10*3/uL (ref 0.0–0.7)
Eosinophils Relative: 4.4 % (ref 0.0–5.0)
HCT: 34.6 % — ABNORMAL LOW (ref 36.0–46.0)
Hemoglobin: 11.6 g/dL — ABNORMAL LOW (ref 12.0–15.0)
Lymphocytes Relative: 31.9 % (ref 12.0–46.0)
Lymphs Abs: 2.7 10*3/uL (ref 0.7–4.0)
MCHC: 33.5 g/dL (ref 30.0–36.0)
MCV: 82 fl (ref 78.0–100.0)
Monocytes Absolute: 0.6 10*3/uL (ref 0.1–1.0)
Monocytes Relative: 7 % (ref 3.0–12.0)
Neutro Abs: 4.7 10*3/uL (ref 1.4–7.7)
Neutrophils Relative %: 55.9 % (ref 43.0–77.0)
Platelets: 381 10*3/uL (ref 150.0–400.0)
RBC: 4.22 Mil/uL (ref 3.87–5.11)
RDW: 13 % (ref 11.5–15.5)
WBC: 8.3 10*3/uL (ref 4.0–10.5)

## 2022-01-14 LAB — C-REACTIVE PROTEIN: CRP: <1 mg/dL (ref 0.5–20.0)

## 2022-01-14 LAB — VITAMIN D 25 HYDROXY (VIT D DEFICIENCY, FRACTURES): VITD: 11.71 ng/mL — ABNORMAL LOW (ref 30.00–100.00)

## 2022-01-14 LAB — URIC ACID: Uric Acid, Serum: 5 mg/dL (ref 2.4–7.0)

## 2022-01-14 LAB — SEDIMENTATION RATE: Sed Rate: 68 mm/hr — ABNORMAL HIGH (ref 0–30)

## 2022-01-14 LAB — TSH: TSH: 1.85 u[IU]/mL (ref 0.35–5.50)

## 2022-01-14 LAB — FERRITIN: Ferritin: 39.5 ng/mL (ref 10.0–291.0)

## 2022-01-14 NOTE — Patient Instructions (Addendum)
Good to see you  ?Recommend water aerobic, elliptical, and stationary bike for exercises ?Labs on the way out  ?Referral to rheumatology  ?As needed follow up  ?

## 2022-01-16 LAB — ANA: Anti Nuclear Antibody (ANA): NEGATIVE

## 2022-01-16 LAB — RHEUMATOID FACTOR: Rheumatoid fact SerPl-aCnc: <14 IU/mL (ref <14)

## 2022-01-16 LAB — CYCLIC CITRUL PEPTIDE ANTIBODY, IGG: Cyclic Citrullin Peptide Ab: <16 UNITS

## 2022-01-16 NOTE — Progress Notes (Signed)
Called and spoke with patient and told her about Vitamin D that Dr. Jean Rosenthal had recommended and let her know that rheumatology referral was denied and that she could follow up in clinic with [place]  ?

## 2022-01-29 ENCOUNTER — Encounter: Payer: Self-pay | Admitting: Family

## 2022-01-29 ENCOUNTER — Ambulatory Visit (INDEPENDENT_AMBULATORY_CARE_PROVIDER_SITE_OTHER): Payer: 59 | Admitting: Family

## 2022-01-29 ENCOUNTER — Other Ambulatory Visit: Payer: Self-pay | Admitting: Family Medicine

## 2022-01-29 VITALS — BP 124/82 | HR 89 | Temp 98.3°F | Ht 64.0 in | Wt 173.2 lb

## 2022-01-29 DIAGNOSIS — J029 Acute pharyngitis, unspecified: Secondary | ICD-10-CM | POA: Diagnosis not present

## 2022-01-29 DIAGNOSIS — R519 Headache, unspecified: Secondary | ICD-10-CM

## 2022-01-29 LAB — POCT RAPID STREP A (OFFICE): Rapid Strep A Screen: NEGATIVE

## 2022-01-29 MED ORDER — PANTOPRAZOLE SODIUM 20 MG PO TBEC: 20.0000 mg | DELAYED_RELEASE_TABLET | Freq: Every day | ORAL | 0 refills | Status: DC

## 2022-01-29 NOTE — Progress Notes (Signed)
? ?Subjective:  ? ? ? Patient ID: Sherri Wolf, female    DOB: 1966-09-20, 56 y.o.   MRN: [id number] ? ?Chief Complaint  ?Patient presents with  ? Sore Throat  ?  Pt c/o sore throat  for about a week. Has not tried anything for it. Not sure if been around with anyone with strep.   ? Headache  ?  Pt c/o headaches, body aches and stomach for about a week.   ? ?HPI: ?Sore Throat: Patient complains of sore throat. Associated symptoms include post nasal drip, sinus and nasal congestion, sore throat, and swollen glands, and headache. Onset of symptoms was 1 week ago, unchanged since that time. She is drinking moderate amounts of fluids. She has not had recent close exposure to someone with proven streptococcal pharyngitis.  ?GERD: Patient is presenting for new symptoms. This has been associated with fullness after meals and midespigastric pain.  She denies abdominal bloating, chest pain, cough, heartburn, need to clear throat frequently, and symptoms primarily relate to meals, and lying down after meals. Symptoms have been present for  weeks. She denies dysphagia.  She has not lost weight. She denies melena, hematochezia, hematemesis, and coffee ground emesis. Medical therapy in the past has included none. ? ?Assessment & Plan:  ? ?Problem List Items Addressed This Visit   ?None ?Visit Diagnoses   ? ? Sore throat    -  Primary  ? rapid strep test neg. Advised pt to take Tylenol 1,000mg  or Ibuprofen 600mg  3 times per day for sore throat pain, swelling, and fever. Based on other sx today, starting trial of Protonix, advised on use & SE. Call office if sx not improved. ? ?Relevant Medications  ? pantoprazole (PROTONIX) 20 MG tablet  ? Other Relevant Orders  ? POCT rapid strep A (Completed)  ? Sinus headache     ?with sinus pressure, congestion, mild drainage. Norel AD samples provided for 5 days, bid. Advised on also taking OTC antihistamine daily, use saline nasal spray tid for the next few weeks to prevent future symptoms r/t  allergy season. OK to continue to take up to 600mg  Ibuprofen or 1,000mg  Tylenol tid prn for the next week, if persists beyond this time, call the office.  ? ?  ? ?Outpatient Medications Prior to Visit  ?Medication Sig Dispense Refill  ? DULoxetine (CYMBALTA) 30 MG capsule Take 2 capsules (60 mg total) by mouth daily AND 1 capsule (30 mg total) daily. 270 capsule 1  ? furosemide (LASIX) 20 MG tablet Take 1 tablet (20 mg total) by mouth daily. 30 tablet 3  ? hydrOXYzine (ATARAX) 25 MG tablet Take 1 tablet (25 mg total) by mouth as needed. 90 tablet 1  ? methocarbamol (ROBAXIN) 500 MG tablet Take 1 tablet (500 mg total) by mouth every 8 (eight) hours as needed for muscle spasms. 30 tablet 3  ? Multiple Vitamins-Minerals (ONCOVITE) TABS Take 1 capsule by mouth daily.    ? omeprazole (PRILOSEC) 40 MG capsule Take 1 capsule (40 mg total) by mouth daily. 90 capsule 1  ? potassium chloride SA (KLOR-CON M) 20 MEQ tablet Take 1 tablet (20 mEq total) by mouth daily. 30 tablet 3  ? rosuvastatin (CRESTOR) 10 MG tablet Take 1 tablet by mouth daily.    ? amoxicillin-clavulanate (AUGMENTIN) 875-125 MG tablet Take 1 tablet by mouth 2 (two) times daily. 10 tablet 0  ? predniSONE (DELTASONE) 20 MG tablet Take 1 tablet (20 mg total) by mouth daily with breakfast. 5 tablet  0  ? ?No facility-administered medications prior to visit.  ? ? ?Past Medical History:  ?Diagnosis Date  ? Acid reflux   ? Hyperlipidemia   ? Hypertension 2017  ? resolved  ? Sleep apnea   ? used to wear cpap  ? ? ?Past Surgical History:  ?Procedure Laterality Date  ? TOTAL VAGINAL HYSTERECTOMY  2019  ? ? ?No Known Allergies ? ?   ?Objective:  ?  ?Physical Exam ?Vitals and nursing note reviewed.  ?Constitutional:   ?   Appearance: Normal appearance.  ?HENT:  ?   Right Ear: Tympanic membrane and ear canal normal.  ?   Left Ear: Tympanic membrane and ear canal normal.  ?   Nose:  ?   Right Sinus: Frontal sinus tenderness present. No maxillary sinus tenderness.  ?   Left  Sinus: Frontal sinus tenderness present. No maxillary sinus tenderness.  ?   Mouth/Throat:  ?   Mouth: Mucous membranes are moist. Angioedema present.  ?   Pharynx: Posterior oropharyngeal erythema present. No pharyngeal swelling, oropharyngeal exudate or uvula swelling.  ?Cardiovascular:  ?   Rate and Rhythm: Normal rate and regular rhythm.  ?Pulmonary:  ?   Effort: Pulmonary effort is normal.  ?   Breath sounds: Normal breath sounds.  ?Musculoskeletal:     ?   General: Normal range of motion.  ?Skin: ?   General: Skin is warm and dry.  ?Neurological:  ?   Mental Status: She is alert.  ?Psychiatric:     ?   Mood and Affect: Mood normal.     ?   Behavior: Behavior normal.  ? ? ?BP 124/82 (BP Location: Left Arm, Patient Position: Sitting, Cuff Size: Large)   Pulse 89   Temp 98.3 ?F (36.8 ?C) (Temporal)   Ht 5\' 4"  (1.626 m)   Wt 173 lb 4 oz (78.6 kg)   SpO2 97%   BMI 29.74 kg/m?  ?Wt Readings from Last 3 Encounters:  ?01/29/22 173 lb 4 oz (78.6 kg)  ?01/14/22 178 lb (80.7 kg)  ?01/07/22 175 lb 9.6 oz (79.7 kg)  ? ? ?Meds ordered this encounter  ?Medications  ? pantoprazole (PROTONIX) 20 MG tablet  ?  Sig: Take 1 tablet (20 mg total) by mouth daily. Take 1 pill twice a day for 2 weeks, then 1 pill every morning.  ?  Dispense:  30 tablet  ?  Refill:  0  ?  Order Specific Question:   Supervising Provider  ?  Answer:   ANDY, CAMILLE L [[id number]]  ? ? ?01/09/22, NP ? ?

## 2022-01-29 NOTE — Patient Instructions (Addendum)
It was very nice to see you today! ? ?I am sending generic Protonix to help with your sore throat and stomach pain. Take 1 pill twice a day for 2 weeks, then reduce to 1 pill daily. Let us know if this is not helping. ? ?I also have given you samples of Norel AD to help with your sinus symptoms, take 1 pill twice a day until finished. ? ? ? ? ?PLEASE NOTE: ? ?If you had any lab tests please let us know if you have not heard back within a few days. You may see your results on MyChart before we have a chance to review them but we will give you a call once they are reviewed by [place]. If we ordered any referrals today, please let us know if you have not heard from their office within the next week.  ? ?Please try these tips to maintain a healthy lifestyle: ? ?Eat most of your calories during the day when you are active. Eliminate processed foods including packaged sweets (pies, cakes, cookies), reduce intake of potatoes, white bread, white pasta, and white rice. Look for whole grain options, oat flour or almond flour. ? ?Each meal should contain half fruits/vegetables, one quarter protein, and one quarter carbs (no bigger than a computer mouse). ? ?Cut down on sweet beverages. This includes juice, soda, and sweet tea. Also watch fruit intake, though this is a healthier sweet option, it still contains natural sugar! Limit to 3 servings daily. ? ?Drink at least 1 glass of water with each meal and aim for at least 8 glasses per day ? ?Exercise at least 150 minutes every week.  ? ?

## 2022-02-14 ENCOUNTER — Encounter: Payer: Self-pay | Admitting: Dietician

## 2022-02-14 ENCOUNTER — Encounter: Payer: 59 | Attending: Family Medicine | Admitting: Dietician

## 2022-02-14 DIAGNOSIS — Z6829 Body mass index (BMI) 29.0-29.9, adult: Secondary | ICD-10-CM | POA: Insufficient documentation

## 2022-02-14 DIAGNOSIS — E669 Obesity, unspecified: Secondary | ICD-10-CM | POA: Diagnosis present

## 2022-02-14 DIAGNOSIS — E639 Nutritional deficiency, unspecified: Secondary | ICD-10-CM

## 2022-02-14 DIAGNOSIS — Z789 Other specified health status: Secondary | ICD-10-CM | POA: Insufficient documentation

## 2022-02-14 DIAGNOSIS — Z713 Dietary counseling and surveillance: Secondary | ICD-10-CM | POA: Diagnosis not present

## 2022-02-14 DIAGNOSIS — E46 Unspecified protein-calorie malnutrition: Secondary | ICD-10-CM | POA: Insufficient documentation

## 2022-02-14 DIAGNOSIS — K219 Gastro-esophageal reflux disease without esophagitis: Secondary | ICD-10-CM

## 2022-02-14 NOTE — Patient Instructions (Addendum)
The next time you purchase the vitamin B-12 get one that is Dissolvable or Sublingual (dissolve in mouth rather than swallow). Continue to take the Vitamin B-12 and Vitamin D daily.  Consider adding Ground flax seeds and/or chia seeds to your diet (brain health, mental health, helps triglycerides, digestion etc. - source of Omega 3).  Walnuts are also a good source of Omega 3 fat.  Option for digestion: Integrative therapeutics Rhizinate 3X Extra-strength deglycyrrhizinated licorice - german chocolate flavor  Avoid black pepper, limit spicy foods  Limit cinnamon if this increases symptoms  Include ginger, cardamom, turmeric Avoid eating for 2-3 hours before bed and get more of your nutrition earlier in the day. Fiber Fueled Cookbook by Lowanda Foster, MD (gastroenterologist)  Aim for 70 grams of protein per day.  Start protein shake once daily. High protein plant foods include lentils and other legumes/beans, tofu, nuts, seeds, nut butters Choose a protein for each meal and snack Replace the sugary foods with protein foods Continue low sodium  Laurette Schimke, MD has videos on line that speak about improving autoimmune conditions with what we eat

## 2022-02-14 NOTE — Progress Notes (Signed)
Medical Nutrition Therapy  Appointment Start time:  1055  Appointment End time:  1210  Primary concerns today: weight, inflammation, symptoms of increased belching  She states that she craves sugar (homemade [place] sweets and has been decreasing the amount of sugar in the recipe) Referral diagnosis: obesity, protein-calorie malnutrition, vegetarian (avoids eggs) Preferred learning style: no preference indicated Learning readiness: ready, change in progress  NUTRITION ASSESSMENT   Anthropometrics  64" 174 lbs 02/14/2022 189 lbs 09/2021  highest adult weight 160's lowest adult weight 2005 when she moved from [place]   Clinical Medical Hx: GERD, OSA (no c-pap at this time), HLD, HTN) Medications: rosuvastatin, furosemide, protonix prn Labs: cholesterol 317, HDL 58, LDL 221, Triglycerides 187, vitamin D 11.7 , vitamin B-12 253 on 12/31/2021  Notable Signs/Symptoms: "a lot of hair loss" for the past 6 months, very dry skin, itchy skin, vertical ridges on nails, belching, fluid retention in legs, early satiety, constipation (resolved with Triphala Guggulu (shuddha Guggulu - Ayurveda medicine and prior would not have a bowel movement for 7-10 days)  Nutrition focussed physical exam completed: Swelling in legs noted Easily pluck able hair Vertical ridging on nails and small amount of horizontal ridges Strength/muscle normal Overweight White spots on legs Extremely dry skin noted  Lifestyle & Dietary Hx Patient lives with her husband.  She is vegetarian (no eggs but will eat dairy).  They share shopping and cooking.  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  Estimated daily fluid intake Supplements: Vitamin D. Vitamin B-12, MVI Sleep: good - almost 8 hours Stress / self-care: high stress related to her personality "keeps things inside"  overeats due to stress  Current average weekly physical activity: increased pain and leg swelling when she  exercises too much -started at a gym 2 months ago and enjoys cycling and resistance training for arms.  Notices that her body is more stiff and increased pain when she does not exercise.)  24-Hr Dietary Recall First Meal: lemon water, indian tea with sugar and milk,ginger, cardamom, cinnamon, homemade indian snacks, occasional vegetables Snack: none Second Meal: occasional very small amounts Snack: banana Third Meal: rice, curry Snack (8:30 pm):  apple, chips, snacks, ice cream, or homemade indian cookie Beverages: water, tea, milk, turmeric tea  Estimated Energy Needs Calories: 1500 Protein: 70g   NUTRITION DIAGNOSIS  NI-5.5 Imbalance of nutrients As related to low vitamin D, low vitamin B-12, inadequate protein intake, dry skin, and nail ridges.  As evidenced by lab reports, observation, easily pluckable hair.  NUTRITION INTERVENTION  Nutrition education (E-1) on the following topics:  Supplementation options for Omega 3 fatty acids that are plant based. GERD symptoms, nutrition tips, alternative options to treat, book of plant based gastroenterologist who discusses symptoms and treatment Nutritional deficiencies, supplementation Protein sources (plant based) and importance of prioritizing these foods with each meal and replacing sugary foods with protein rich foods Discussed her continued low sodium diet and recommendations to continue Discussion of autoimmune concerns and hypernutrition benefit  Handouts Provided Include  GERD medical nutrition therapy from AND Vegetarian proteins   Learning Style & Readiness for Change Teaching method utilized: Visual & Auditory  Demonstrated degree of understanding via: Teach Back  Barriers to learning/adherence to lifestyle change: symptoms  Plan: The next time you purchase the vitamin B-12 get one that is Dissolvable or Sublingual (dissolve in mouth rather than swallow). Continue to take the Vitamin B-12 and Vitamin D daily.  Consider  adding Ground flax  seeds and/or chia seeds to your diet (brain health, mental health, helps triglycerides, digestion etc. - source of Omega 3).  Walnuts are also a good source of Omega 3 fat.  Option for digestion: Integrative therapeutics Rhizinate 3X Extra-strength deglycyrrhizinated licorice - german chocolate flavor  Avoid black pepper, limit spicy foods  Limit cinnamon if this increases symptoms  Include ginger, cardamom, turmeric Avoid eating for 2-3 hours before bed and get more of your nutrition earlier in the day. Fiber Fueled Cookbook by Lowanda Foster, MD (gastroenterologist)  Aim for 70 grams of protein per day.  Start protein shake once daily. High protein plant foods include lentils and other legumes/beans, tofu, nuts, seeds, nut butters Choose a protein for each meal and snack Replace the sugary foods with protein foods Continue low sodium  Laurette Schimke, MD has videos on line that speak about improving autoimmune conditions with what we eat   MONITORING & EVALUATION Dietary intake, weekly physical activity, and symptoms in 1 month.  Next Steps  Patient is to call for questions.

## 2022-02-25 ENCOUNTER — Other Ambulatory Visit: Payer: Self-pay | Admitting: Family

## 2022-02-25 DIAGNOSIS — J029 Acute pharyngitis, unspecified: Secondary | ICD-10-CM

## 2022-03-04 ENCOUNTER — Other Ambulatory Visit: Payer: Self-pay | Admitting: Family Medicine

## 2022-03-04 NOTE — Telephone Encounter (Signed)
Spoke to patient, she did not ask for refill of this medication, she does not take anymore. Patient also stated that she did not need a refill of the lasix.

## 2022-03-05 ENCOUNTER — Other Ambulatory Visit: Payer: Self-pay | Admitting: Family Medicine

## 2022-03-05 MED ORDER — DULOXETINE HCL 60 MG PO CPEP: 60.0000 mg | ORAL_CAPSULE | Freq: Every day | ORAL | 3 refills | Status: DC

## 2022-03-21 ENCOUNTER — Ambulatory Visit: Payer: 59 | Admitting: Dietician

## 2022-03-26 ENCOUNTER — Other Ambulatory Visit: Payer: Self-pay | Admitting: Family

## 2022-03-26 DIAGNOSIS — J029 Acute pharyngitis, unspecified: Secondary | ICD-10-CM

## 2022-03-31 ENCOUNTER — Other Ambulatory Visit: Payer: Self-pay | Admitting: Family Medicine

## 2022-05-24 LAB — HM MAMMOGRAPHY

## 2022-05-29 ENCOUNTER — Encounter: Payer: Self-pay | Admitting: Family Medicine

## 2022-06-16 ENCOUNTER — Encounter: Payer: Self-pay | Admitting: *Deleted

## 2022-06-30 ENCOUNTER — Encounter: Payer: Self-pay | Admitting: Family Medicine

## 2022-06-30 ENCOUNTER — Ambulatory Visit (INDEPENDENT_AMBULATORY_CARE_PROVIDER_SITE_OTHER): Payer: Commercial Managed Care - HMO | Admitting: Family Medicine

## 2022-06-30 VITALS — BP 126/82 | HR 83 | Temp 98.2°F | Ht 64.0 in | Wt 171.8 lb

## 2022-06-30 DIAGNOSIS — E782 Mixed hyperlipidemia: Secondary | ICD-10-CM

## 2022-06-30 DIAGNOSIS — J329 Chronic sinusitis, unspecified: Secondary | ICD-10-CM

## 2022-06-30 DIAGNOSIS — R059 Cough, unspecified: Secondary | ICD-10-CM

## 2022-06-30 DIAGNOSIS — E559 Vitamin D deficiency, unspecified: Secondary | ICD-10-CM

## 2022-06-30 DIAGNOSIS — I10 Essential (primary) hypertension: Secondary | ICD-10-CM

## 2022-06-30 DIAGNOSIS — E538 Deficiency of other specified B group vitamins: Secondary | ICD-10-CM

## 2022-06-30 DIAGNOSIS — L299 Pruritus, unspecified: Secondary | ICD-10-CM

## 2022-06-30 LAB — POC COVID19 BINAXNOW: SARS Coronavirus 2 Ag: NEGATIVE

## 2022-06-30 LAB — POCT INFLUENZA A/B
Influenza A, POC: NEGATIVE
Influenza B, POC: NEGATIVE

## 2022-06-30 MED ORDER — AZELASTINE HCL 0.1 % NA SOLN: 2.0000 | Freq: Two times a day (BID) | NASAL | 12 refills | Status: DC

## 2022-06-30 MED ORDER — HYDROXYZINE HCL 25 MG PO TABS: 25.0000 mg | ORAL_TABLET | ORAL | 1 refills | Status: DC | PRN

## 2022-06-30 MED ORDER — AZITHROMYCIN 250 MG PO TABS: ORAL_TABLET | ORAL | 0 refills | Status: DC

## 2022-06-30 NOTE — Patient Instructions (Signed)
It was very nice to see you today!  Your COVID and flu test were both negative today.  Please start the Astelin nasal spray.  Start antibiotic if not improving.  We will check blood work today.  Let us know if your symptoms are not improving by next week.  Take care, Dr Jerline Pain  PLEASE NOTE:  If you had any lab tests please let us know if you have not heard back within a few days. You may see your results on mychart before we have a chance to review them but we will give you a call once they are reviewed by [place]. If we ordered any referrals today, please let us know if you have not heard from their office within the next week.   Please try these tips to maintain a healthy lifestyle:  Eat at least 3 REAL meals and 1-2 snacks per day.  Aim for no more than 5 hours between eating.  If you eat breakfast, please do so within one hour of getting up.   Each meal should contain half fruits/vegetables, one quarter protein, and one quarter carbs (no bigger than a computer mouse)  Cut down on sweet beverages. This includes juice, soda, and sweet tea.   Drink at least 1 glass of water with each meal and aim for at least 8 glasses per day  Exercise at least 150 minutes every week.

## 2022-06-30 NOTE — Progress Notes (Unsigned)
   Sherri Wolf is a 56 y.o. female who presents today for an office visit.  Assessment/Plan:  New/Acute Problems: Sinusitis  Likely viral.  We will start Astelin nasal spray.  We will send in pocket prescription for azithromycin with instruction not start unless symptoms or not improving in the next few days.  COVID and flu test both negative today.  Advised patient to let us know if symptoms worsen or not proving next 2 days and we can repeat COVID test at that point.  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  Continue topical emollients.  We will refill hydroxyzine.  Avitaminosis D Check vitamin D per patient request  Low B12 Check B12 per patient request  Dyslipidemia On Crestor 10 mg daily.  We will recheck lipid panel per patient request.    Subjective:  HPI:  Patient here with cough for the last 3-4 weeks. Tried taking OTC medications without much improvement. Yesterday started having more congestion and runny nose She works in school. Symptoms include cough, mylagia, subjective fevers. No chest pain or shortness of breath.        Objective:  Physical Exam: BP 126/82   Pulse 83   Temp 98.2 F (36.8 C) (Temporal)   Ht 5\' 4"  (1.626 m)   Wt 171 lb 12.8 oz (77.9 kg)   SpO2 99%   BMI 29.49 kg/m   Gen: No acute distress, resting comfortably HEENT: TMs are clear.  Nasal [place] erythematous and boggy with clear discharge.  OP erythematous. CV: Regular rate and rhythm with no murmurs appreciated Pulm: Normal work of breathing, clear to auscultation bilaterally with no crackles, wheezes, or rhonchi Neuro: Grossly normal, moves all extremities Psych: Normal affect and thought content      Sherri Wolf M. Jerline Pain, MD 06/30/2022 3:01 PM

## 2022-07-01 LAB — LIPID PANEL
Cholesterol: 292 mg/dL — ABNORMAL HIGH (ref 0–200)
HDL: 51.3 mg/dL (ref >39.00)
NonHDL: 241.03
Total CHOL/HDL Ratio: 6
Triglycerides: 303 mg/dL — ABNORMAL HIGH (ref 0.0–149.0)
VLDL: 60.6 mg/dL — ABNORMAL HIGH (ref 0.0–40.0)

## 2022-07-01 LAB — VITAMIN D 25 HYDROXY (VIT D DEFICIENCY, FRACTURES): VITD: 20.54 ng/mL — ABNORMAL LOW (ref 30.00–100.00)

## 2022-07-01 LAB — LDL CHOLESTEROL, DIRECT: Direct LDL: 219 mg/dL

## 2022-07-01 LAB — VITAMIN B12: Vitamin B-12: 688 pg/mL (ref 211–911)

## 2022-07-03 NOTE — Progress Notes (Signed)
Please inform patient of the following:  Cholesterol level very high.  Recommend increasing his Crestor to 20 mg daily.  This should be rechecked in 3 to 6 months.  Vitamin D is also low.  Recommend he take at least 5000 IU daily.  This should also be rechecked in 3 to 6 months.

## 2022-07-08 MED ORDER — ROSUVASTATIN CALCIUM 20 MG PO TABS: 20.0000 mg | ORAL_TABLET | Freq: Every day | ORAL | 3 refills | Status: DC

## 2022-07-08 NOTE — Addendum Note (Signed)
Addended by: Bing Neighbors on: 07/08/2022 09:47 AM   Modules accepted: Orders

## 2022-07-23 ENCOUNTER — Ambulatory Visit: Payer: 59 | Admitting: Physician Assistant

## 2022-08-01 ENCOUNTER — Ambulatory Visit: Payer: Commercial Managed Care - HMO | Admitting: Family Medicine

## 2022-08-13 ENCOUNTER — Encounter: Payer: Self-pay | Admitting: Family Medicine

## 2022-08-13 ENCOUNTER — Ambulatory Visit (INDEPENDENT_AMBULATORY_CARE_PROVIDER_SITE_OTHER): Payer: Commercial Managed Care - HMO | Admitting: Family Medicine

## 2022-08-13 VITALS — BP 120/70 | HR 87 | Temp 97.8°F | Ht 64.0 in | Wt 166.2 lb

## 2022-08-13 DIAGNOSIS — K5900 Constipation, unspecified: Secondary | ICD-10-CM | POA: Diagnosis not present

## 2022-08-13 DIAGNOSIS — R1084 Generalized abdominal pain: Secondary | ICD-10-CM | POA: Diagnosis not present

## 2022-08-13 LAB — POCT URINALYSIS DIPSTICK
Bilirubin, UA: NEGATIVE
Blood, UA: NEGATIVE
Glucose, UA: NEGATIVE
Ketones, UA: NEGATIVE
Leukocytes, UA: NEGATIVE
Nitrite, UA: NEGATIVE
Protein, UA: NEGATIVE
Spec Grav, UA: 1.025 (ref 1.010–1.025)
Urobilinogen, UA: 0.2 E.U./dL
pH, UA: 6 (ref 5.0–8.0)

## 2022-08-13 NOTE — Progress Notes (Signed)
Subjective:     Patient ID: Sherri Wolf, female    DOB: 1965-09-24, 56 y.o.   MRN: [id number]  Chief Complaint  Patient presents with   Follow-up    Urgent Care follow-up on 07/29/2022 for constipation, still having some abdominal pain and still feeling not completely empty    HPI  Went to UC on 11/7 for constipation for 3 wks but was worse 5d pta.had tried enema, glycerin supp.  Taking stool softener daily. For 7-71months-200mg  bid.water and fiber.  Still having some abd pain and not feeling like resolved.  Xray showed no obs but large stool volume in asc and desc colon/rectal vault Still feels bloated and some pain and strains.  Occ blood. Cscope in [place] 4-5 yrs ago. Polyps in 2007 and then again in 69yrs.  Pain upper abd-has to lean forward.   Bm daily and gas.  No n/v.  No f/c.    Ate 1 hr ago   Health Maintenance Due  Topic Date Due   HIV Screening  Never done   Hepatitis C Screening  Never done    Past Medical History:  Diagnosis Date   Acid reflux    Hyperlipidemia    Hypertension 2017   resolved   Sleep apnea    used to wear cpap    Past Surgical History:  Procedure Laterality Date   TOTAL VAGINAL HYSTERECTOMY  2019    Outpatient Medications Prior to Visit  Medication Sig Dispense Refill   azelastine (ASTELIN) 0.1 % nasal spray Place 2 sprays into both nostrils 2 (two) times daily. 30 mL 12   cyanocobalamin (VITAMIN B12) 1000 MCG tablet Take 1 tablet by mouth daily.     furosemide (LASIX) 20 MG tablet TAKE 1 TABLET BY MOUTH EVERY DAY 90 tablet 1   hydrOXYzine (ATARAX) 25 MG tablet Take 1 tablet (25 mg total) by mouth as needed. 90 tablet 1   KLOR-CON M20 20 MEQ tablet TAKE 1 TABLET BY MOUTH EVERY DAY 90 tablet 1   methocarbamol (ROBAXIN) 500 MG tablet Take 1 tablet (500 mg total) by mouth every 8 (eight) hours as needed for muscle spasms. 30 tablet 3   Multiple Vitamins-Minerals (ONCOVITE) TABS Take 1 capsule by mouth daily.     omeprazole (PRILOSEC) 40 MG capsule  Take 1 capsule (40 mg total) by mouth daily. 90 capsule 1   pantoprazole (PROTONIX) 20 MG tablet TAKE 1 TABLET (20 MG TOTAL) BY MOUTH DAILY. TAKE 1 PILL TWICE A DAY FOR 2 WEEKS, THEN 1 PILL EVERY MORNING. 30 tablet 0   polyethylene glycol powder (GLYCOLAX/MIRALAX) 17 GM/SCOOP powder Take by mouth.     rosuvastatin (CRESTOR) 20 MG tablet Take 1 tablet (20 mg total) by mouth daily. 90 tablet 3   Vitamin D, Ergocalciferol, 50 MCG (2000 UT) CAPS Take by mouth.     azithromycin (ZITHROMAX) 250 MG tablet Take 2 tabs day 1, then 1 tab daily 6 each 0   DULoxetine (CYMBALTA) 30 MG capsule Take 1 capsule (30 mg total) by mouth daily. 90 capsule 3   DULoxetine (CYMBALTA) 60 MG capsule Take 1 capsule (60 mg total) by mouth daily. 90 capsule 3   vitamin B-12 (CYANOCOBALAMIN) 100 MCG tablet Take 100 mcg by mouth daily.     No facility-administered medications prior to visit.    No Known Allergies ROS neg/noncontributory except as noted HPI/below      Objective:     BP 120/70   Pulse 87   Temp 97.8  F (36.6 C) (Temporal)   Ht 5\' 4"  (1.626 m)   Wt 166 lb 4 oz (75.4 kg)   SpO2 99%   BMI 28.54 kg/m  Wt Readings from Last 3 Encounters:  08/13/22 166 lb 4 oz (75.4 kg)  06/30/22 171 lb 12.8 oz (77.9 kg)  02/14/22 174 lb (78.9 kg)    Physical Exam   Gen: WDWN NAD HEENT: NCAT, conjunctiva not injected, sclera nonicteric NECK:  supple, no thyromegaly, no nodes, no carotid bruits CARDIAC: RRR, S1S2+, no murmur. DP 2+B LUNGS: CTAB. No wheezes ABDOMEN:  BS+, soft, diffusely tender esp lower abd, No HSM, no masses.  Some CVAT on R EXT:  no edema MSK: no gross abnormalities.  NEURO: A&O x3.  CN II-XII intact.  PSYCH: normal mood. Good eye contact  Results for orders placed or performed in visit on 08/13/22  POCT urinalysis dipstick  Result Value Ref Range   Color, UA YELLOW    Clarity, UA CLEAR    Glucose, UA Negative Negative   Bilirubin, UA NEG    Ketones, UA NEG    Spec Grav, UA 1.025  1.010 - 1.025   Blood, UA NEG    pH, UA 6.0 5.0 - 8.0   Protein, UA Negative Negative   Urobilinogen, UA 0.2 0.2 or 1.0 E.U./dL   Nitrite, UA NEG    Leukocytes, UA Negative Negative   Appearance     Odor          Assessment & Plan:   Problem List Items Addressed This Visit   None Visit Diagnoses     Generalized abdominal pain    -  Primary   Relevant Orders   Comprehensive metabolic panel   CBC with Differential/Platelet   Amylase   Lipase   POCT urinalysis dipstick   CT Abdomen Pelvis W Contrast   Ambulatory referral to Gastroenterology   Constipation, unspecified constipation type       Relevant Orders   CT Abdomen Pelvis W Contrast   Ambulatory referral to Gastroenterology      Abd pain/constipation-acute on chronic.  Xray-no blockages.  ?slo transit, mass, other.   Pain worse, fevers, etc-go to emergency room. I'm setting up CT scan and GI referral Miralax-whole bottle in 2 liters of gatorade.       Then 3 capfuls daily.   Tummy massage start at Right Has h/o abd surgeries.  No orders of the defined types were placed in this encounter.   08/15/22, MD

## 2022-08-13 NOTE — Patient Instructions (Signed)
It was very nice to see you today!  Pain worse, fevers, etc-go to emergency room  I'm setting up CT scan and GI referral  Miralax-whole bottle in 2 liters of gatorade.       Then 3 capfuls daily.   Tummy massage start at Right   PLEASE NOTE:  If you had any lab tests please let us know if you have not heard back within a few days. You may see your results on MyChart before we have a chance to review them but we will give you a call once they are reviewed by [place]. If we ordered any referrals today, please let us know if you have not heard from their office within the next week.   Please try these tips to maintain a healthy lifestyle:  Eat most of your calories during the day when you are active. Eliminate processed foods including packaged sweets (pies, cakes, cookies), reduce intake of potatoes, white bread, white pasta, and white rice. Look for whole grain options, oat flour or almond flour.  Each meal should contain half fruits/vegetables, one quarter protein, and one quarter carbs (no bigger than a computer mouse).  Cut down on sweet beverages. This includes juice, soda, and sweet tea. Also watch fruit intake, though this is a healthier sweet option, it still contains natural sugar! Limit to 3 servings daily.  Drink at least 1 glass of water with each meal and aim for at least 8 glasses per day  Exercise at least 150 minutes every week.

## 2022-08-14 LAB — CBC WITH DIFFERENTIAL/PLATELET
Absolute Monocytes: 544 cells/uL (ref 200–950)
Basophils Absolute: 94 cells/uL (ref 0–200)
Basophils Relative: 1.1 %
Eosinophils Absolute: 213 cells/uL (ref 15–500)
Eosinophils Relative: 2.5 %
HCT: 36.9 % (ref 35.0–45.0)
Hemoglobin: 12.7 g/dL (ref 11.7–15.5)
Lymphs Abs: 2516 cells/uL (ref 850–3900)
MCH: 27.7 pg (ref 27.0–33.0)
MCHC: 34.4 g/dL (ref 32.0–36.0)
MCV: 80.4 fL (ref 80.0–100.0)
MPV: 9.7 fL (ref 7.5–12.5)
Monocytes Relative: 6.4 %
Neutro Abs: 5134 cells/uL (ref 1500–7800)
Neutrophils Relative %: 60.4 %
Platelets: 399 10*3/uL (ref 140–400)
RBC: 4.59 10*6/uL (ref 3.80–5.10)
RDW: 12.6 % (ref 11.0–15.0)
Total Lymphocyte: 29.6 %
WBC: 8.5 10*3/uL (ref 3.8–10.8)

## 2022-08-14 LAB — COMPREHENSIVE METABOLIC PANEL
AG Ratio: 1.3 (calc) (ref 1.0–2.5)
ALT: 13 U/L (ref 6–29)
AST: 16 U/L (ref 10–35)
Albumin: 4.2 g/dL (ref 3.6–5.1)
Alkaline phosphatase (APISO): 90 U/L (ref 37–153)
BUN: 15 mg/dL (ref 7–25)
CO2: 29 mmol/L (ref 20–32)
Calcium: 9.6 mg/dL (ref 8.6–10.4)
Chloride: 102 mmol/L (ref 98–110)
Creat: 0.8 mg/dL (ref 0.50–1.03)
Globulin: 3.3 g/dL (calc) (ref 1.9–3.7)
Glucose, Bld: 92 mg/dL (ref 65–99)
Potassium: 4.5 mmol/L (ref 3.5–5.3)
Sodium: 139 mmol/L (ref 135–146)
Total Bilirubin: 0.3 mg/dL (ref 0.2–1.2)
Total Protein: 7.5 g/dL (ref 6.1–8.1)

## 2022-08-14 LAB — LIPASE: Lipase: 21 U/L (ref 7–60)

## 2022-08-14 LAB — AMYLASE: Amylase: 44 U/L (ref 21–101)

## 2022-09-08 ENCOUNTER — Ambulatory Visit
Admission: RE | Admit: 2022-09-08 | Discharge: 2022-09-08 | Disposition: A | Discharge status: Home / Self Care | Payer: Commercial Managed Care - HMO | Source: Ambulatory Visit | Attending: Family Medicine | Admitting: Family Medicine

## 2022-09-08 DIAGNOSIS — R1084 Generalized abdominal pain: Secondary | ICD-10-CM

## 2022-09-08 DIAGNOSIS — Z90710 Acquired absence of both cervix and uterus: Secondary | ICD-10-CM | POA: Diagnosis not present

## 2022-09-08 DIAGNOSIS — R918 Other nonspecific abnormal finding of lung field: Secondary | ICD-10-CM | POA: Diagnosis not present

## 2022-09-08 DIAGNOSIS — R109 Unspecified abdominal pain: Secondary | ICD-10-CM | POA: Diagnosis not present

## 2022-09-08 DIAGNOSIS — K5900 Constipation, unspecified: Secondary | ICD-10-CM

## 2022-09-08 MED ORDER — IOPAMIDOL (ISOVUE-300) INJECTION 61%
100.0000 mL | Freq: Once | INTRAVENOUS | Status: AC | PRN
  Administered 2022-09-08: 100 mL via INTRAVENOUS

## 2022-09-22 DIAGNOSIS — H5213 Myopia, bilateral: Secondary | ICD-10-CM | POA: Diagnosis not present

## 2022-10-21 ENCOUNTER — Ambulatory Visit (INDEPENDENT_AMBULATORY_CARE_PROVIDER_SITE_OTHER): Payer: Medicaid Other | Admitting: Family Medicine

## 2022-10-21 ENCOUNTER — Encounter: Payer: Self-pay | Admitting: Family Medicine

## 2022-10-21 VITALS — BP 109/69 | HR 89 | Temp 98.2°F | Ht 64.0 in | Wt 165.6 lb

## 2022-10-21 DIAGNOSIS — K219 Gastro-esophageal reflux disease without esophagitis: Secondary | ICD-10-CM | POA: Diagnosis not present

## 2022-10-21 DIAGNOSIS — R051 Acute cough: Secondary | ICD-10-CM

## 2022-10-21 DIAGNOSIS — E7800 Pure hypercholesterolemia, unspecified: Secondary | ICD-10-CM

## 2022-10-21 DIAGNOSIS — E559 Vitamin D deficiency, unspecified: Secondary | ICD-10-CM | POA: Diagnosis not present

## 2022-10-21 DIAGNOSIS — R7989 Other specified abnormal findings of blood chemistry: Secondary | ICD-10-CM

## 2022-10-21 DIAGNOSIS — G4733 Obstructive sleep apnea (adult) (pediatric): Secondary | ICD-10-CM | POA: Diagnosis not present

## 2022-10-21 DIAGNOSIS — F339 Major depressive disorder, recurrent, unspecified: Secondary | ICD-10-CM

## 2022-10-21 HISTORY — DX: Major depressive disorder, recurrent, unspecified: F33.9

## 2022-10-21 LAB — COMPREHENSIVE METABOLIC PANEL
ALT: 13 U/L (ref 0–35)
AST: 16 U/L (ref 0–37)
Albumin: 4.1 g/dL (ref 3.5–5.2)
Alkaline Phosphatase: 95 U/L (ref 39–117)
BUN: 8 mg/dL (ref 6–23)
CO2: 27 mEq/L (ref 19–32)
Calcium: 9.5 mg/dL (ref 8.4–10.5)
Chloride: 104 mEq/L (ref 96–112)
Creatinine, Ser: 0.73 mg/dL (ref 0.40–1.20)
GFR: 92.31 mL/min (ref >60.00)
Glucose, Bld: 100 mg/dL — ABNORMAL HIGH (ref 70–99)
Potassium: 5.1 mEq/L (ref 3.5–5.1)
Sodium: 143 mEq/L (ref 135–145)
Total Bilirubin: 0.5 mg/dL (ref 0.2–1.2)
Total Protein: 7.4 g/dL (ref 6.0–8.3)

## 2022-10-21 LAB — VITAMIN D 25 HYDROXY (VIT D DEFICIENCY, FRACTURES): VITD: 14.28 ng/mL — ABNORMAL LOW (ref 30.00–100.00)

## 2022-10-21 LAB — CBC WITH DIFFERENTIAL/PLATELET
Basophils Absolute: 0.1 10*3/uL (ref 0.0–0.1)
Basophils Relative: 1 % (ref 0.0–3.0)
Eosinophils Absolute: 0.6 10*3/uL (ref 0.0–0.7)
Eosinophils Relative: 6.5 % — ABNORMAL HIGH (ref 0.0–5.0)
HCT: 36.2 % (ref 36.0–46.0)
Hemoglobin: 12.2 g/dL (ref 12.0–15.0)
Lymphocytes Relative: 20.1 % (ref 12.0–46.0)
Lymphs Abs: 1.8 10*3/uL (ref 0.7–4.0)
MCHC: 33.7 g/dL (ref 30.0–36.0)
MCV: 80.8 fl (ref 78.0–100.0)
Monocytes Absolute: 0.8 10*3/uL (ref 0.1–1.0)
Monocytes Relative: 9.3 % (ref 3.0–12.0)
Neutro Abs: 5.5 10*3/uL (ref 1.4–7.7)
Neutrophils Relative %: 63.1 % (ref 43.0–77.0)
Platelets: 369 10*3/uL (ref 150.0–400.0)
RBC: 4.47 Mil/uL (ref 3.87–5.11)
RDW: 12.9 % (ref 11.5–15.5)
WBC: 8.7 10*3/uL (ref 4.0–10.5)

## 2022-10-21 LAB — LIPID PANEL
Cholesterol: 282 mg/dL — ABNORMAL HIGH (ref 0–200)
HDL: 47.9 mg/dL (ref >39.00)
LDL Cholesterol: 197 mg/dL — ABNORMAL HIGH (ref 0–99)
NonHDL: 234.57
Total CHOL/HDL Ratio: 6
Triglycerides: 189 mg/dL — ABNORMAL HIGH (ref 0.0–149.0)
VLDL: 37.8 mg/dL (ref 0.0–40.0)

## 2022-10-21 LAB — VITAMIN B12: Vitamin B-12: 942 pg/mL — ABNORMAL HIGH (ref 211–911)

## 2022-10-21 MED ORDER — AZITHROMYCIN 250 MG PO TABS: ORAL_TABLET | ORAL | 0 refills | Status: AC

## 2022-10-21 MED ORDER — ESCITALOPRAM OXALATE 10 MG PO TABS: 10.0000 mg | ORAL_TABLET | Freq: Every day | ORAL | 1 refills | Status: DC

## 2022-10-21 MED ORDER — PANTOPRAZOLE SODIUM 20 MG PO TBEC: 20.0000 mg | DELAYED_RELEASE_TABLET | Freq: Two times a day (BID) | ORAL | 0 refills | Status: DC

## 2022-10-21 NOTE — Patient Instructions (Signed)
Lexapro-1/2 tab per day for 1 wk , then whole tab  Increase pantaprozole to twice/day  Call GI.    Zpack for cough

## 2022-10-21 NOTE — Progress Notes (Signed)
Subjective:     Patient ID: Sherri Wolf, female    DOB: 1965/11/08, 57 y.o.   MRN: [id number]  Chief Complaint  Patient presents with   discuss CT scan    Pt wanting blood work, pt also has cough and cold. Pt states when she coughs It makes her have chest pain and hard to breathe.. pt states she has been very moody/emotional. Pt states she cries suddenly and just doesn't want to do anything, but does it because she has too    HPI  Abd pain/constipation.  Having daily bm-fiberactive powder.  Still doesn't feel like emptying.  Vegetarian.  Concerned w/allergies, 2 tabs of triphla as well.  Drinking water.  Exercising some.  Emotional-not feel like doing anything, crying.  3 wks. Tired.  No SI. Worrying.  Lives w/husb and ok.  Has been on meds in past Feels pressure in chest and congestion-3 wks.  Cough some past 3-4 days.  Sees Card on 2/27 but had sch w/them just for general before this happened.  No f/c.  When coughs, pain R back.  No v/d Osa-not wearing cpap for 2 yrs.  Sleep study in [place].  Did 2021 in [facility].  Would like someone closer.  HLD-on rosuvastatin-want labs.    Health Maintenance Due  Topic Date Due   HIV Screening  Never done   Hepatitis C Screening  Never done   COVID-19 Vaccine (5 - 2023-24 season) 05/23/2022    Past Medical History:  Diagnosis Date   Acid reflux    Hyperlipidemia    Hypertension 2017   resolved   Sleep apnea    used to wear cpap    Past Surgical History:  Procedure Laterality Date   TOTAL VAGINAL HYSTERECTOMY  2019    Outpatient Medications Prior to Visit  Medication Sig Dispense Refill   azelastine (ASTELIN) 0.1 % nasal spray Place 2 sprays into both nostrils 2 (two) times daily. 30 mL 12   cyanocobalamin (VITAMIN B12) 1000 MCG tablet Take 1 tablet by mouth daily.     hydrOXYzine (ATARAX) 25 MG tablet Take 1 tablet (25 mg total) by mouth as needed. 90 tablet 1   methocarbamol (ROBAXIN) 500 MG tablet Take 1 tablet (500 mg total) by  mouth every 8 (eight) hours as needed for muscle spasms. 30 tablet 3   Multiple Vitamins-Minerals (ONCOVITE) TABS Take 1 capsule by mouth daily.     polyethylene glycol powder (GLYCOLAX/MIRALAX) 17 GM/SCOOP powder Take by mouth.     rosuvastatin (CRESTOR) 20 MG tablet Take 1 tablet (20 mg total) by mouth daily. 90 tablet 3   Vitamin D, Ergocalciferol, 50 MCG (2000 UT) CAPS Take by mouth.     furosemide (LASIX) 20 MG tablet TAKE 1 TABLET BY MOUTH EVERY DAY 90 tablet 1   KLOR-CON M20 20 MEQ tablet TAKE 1 TABLET BY MOUTH EVERY DAY 90 tablet 1   omeprazole (PRILOSEC) 40 MG capsule Take 1 capsule (40 mg total) by mouth daily. 90 capsule 1   pantoprazole (PROTONIX) 20 MG tablet TAKE 1 TABLET (20 MG TOTAL) BY MOUTH DAILY. TAKE 1 PILL TWICE A DAY FOR 2 WEEKS, THEN 1 PILL EVERY MORNING. 30 tablet 0   No facility-administered medications prior to visit.    No Known Allergies ROS neg/noncontributory except as noted HPI/below      Objective:     BP 109/69 (BP Location: Right Arm, Patient Position: Sitting)   Pulse 89   Temp 98.2 F (36.8 C) (Temporal)  Ht 5\' 4"  (1.626 m)   Wt 165 lb 9.6 oz (75.1 kg)   SpO2 99%   BMI 28.43 kg/m  Wt Readings from Last 3 Encounters:  10/21/22 165 lb 9.6 oz (75.1 kg)  08/13/22 166 lb 4 oz (75.4 kg)  06/30/22 171 lb 12.8 oz (77.9 kg)    Physical Exam   Gen: WDWN NAD HEENT: NCAT, conjunctiva not injected, sclera nonicteric TM WNL B, OP moist, no exudates  NECK:  supple, no thyromegaly, no nodes, no carotid bruits CARDIAC: RRR, S1S2+, no murmur. DP 2+B LUNGS: CTAB. No wheezes ABDOMEN:  BS+, soft, mod tender lower abd, No HSM, no masses EXT:  no edema MSK: no gross abnormalities.  NEURO: A&O x3.  CN II-XII intact.  PSYCH: normal mood. Good eye contact.  tearful     Assessment & Plan:   Problem List Items Addressed This Visit       Digestive   Gastroesophageal reflux disease without esophagitis - Primary   Relevant Medications   pantoprazole  (PROTONIX) 20 MG tablet   Other Relevant Orders   CBC with Differential/Platelet (Completed)     Other   Hyperlipidemia   Relevant Orders   Comprehensive metabolic panel (Completed)   Lipid panel (Completed)   Depression, recurrent (HCC)   Relevant Medications   escitalopram (LEXAPRO) 10 MG tablet   Other Visit Diagnoses     Acute cough       Relevant Medications   pantoprazole (PROTONIX) 20 MG tablet   OSA (obstructive sleep apnea)       Relevant Orders   Ambulatory referral to Neurology   Low vitamin B12 level       Relevant Orders   Vitamin B12 (Completed)   Vitamin D deficiency       Relevant Orders   VITAMIN D 25 Hydroxy (Vit-D Deficiency, Fractures) (Completed)      GERD/abd pain/constipation-inc pantoprazole to 20mg  bid.  CT abd unremarkable.  Doing otc bowel regimen but doesn't feel like emptying.  Daily bm.  Has referral to GI but awaiting records from other GI-advised pt to go to prev GI to sign release for records to aid process.  Cough-acute-zpk.  Some pain R so poss pneumonia.   Hyperlidemia-chronic.  On crestor-check lipids, cmp Vit D deficiency-check D Vitamin B 12 deficiency-chronic. Pt vegetarian.  On supps.  Check B12 OSA-not on cpap.  Wants doc closer to home.  Refer neuro Depression-recurrent.  Advised prob needs meds for life.  Start lexapro 10mg  daily.  F/u 1 mo.   Meds ordered this encounter  Medications   azithromycin (ZITHROMAX) 250 MG tablet    Sig: Take 2 tablets on day 1, then 1 tablet daily on days 2 through 5    Dispense:  6 tablet    Refill:  0   pantoprazole (PROTONIX) 20 MG tablet    Sig: Take 1 tablet (20 mg total) by mouth 2 (two) times daily before a meal. Take 1 pill twice a day for 2 weeks, then 1 pill every morning.    Dispense:  180 tablet    Refill:  0   escitalopram (LEXAPRO) 10 MG tablet    Sig: Take 1 tablet (10 mg total) by mouth daily.    Dispense:  30 tablet    Refill:  1    Wellington Hampshire, MD

## 2022-10-22 NOTE — Progress Notes (Signed)
1.  Sugar slightly elevated-if fasting, definitely needs to work on diet and exercise.  If not fasting, this explains it 2.  Cholesterol is still very high-if she taken the rosuvastatin daily?  If so, increase it to 40 mg.  If not taking, start taking. 3.  Vitamin B12 is great-continue taking the supplements. 4.  Vitamin D is low-if she is willing, send in 50,000 IUs/week #12/1

## 2022-10-24 ENCOUNTER — Telehealth: Payer: Self-pay | Admitting: Family Medicine

## 2022-10-24 ENCOUNTER — Other Ambulatory Visit: Payer: Self-pay

## 2022-10-24 MED ORDER — VITAMIN D (ERGOCALCIFEROL) 1.25 MG (50000 UNIT) PO CAPS: 50000.0000 [IU] | ORAL_CAPSULE | ORAL | 0 refills | Status: DC

## 2022-10-24 NOTE — Telephone Encounter (Signed)
Patient states she was returning call in regards to her lab results. Requests to be called prior to 2pm if at all possible.

## 2022-10-24 NOTE — Telephone Encounter (Signed)
Spoke to pt. 

## 2022-10-25 ENCOUNTER — Other Ambulatory Visit: Payer: Self-pay | Admitting: Family Medicine

## 2022-11-12 ENCOUNTER — Other Ambulatory Visit: Payer: Self-pay | Admitting: Family Medicine

## 2022-11-12 ENCOUNTER — Other Ambulatory Visit: Payer: Self-pay

## 2022-11-18 ENCOUNTER — Other Ambulatory Visit: Payer: Self-pay | Admitting: Cardiology

## 2022-11-18 ENCOUNTER — Encounter: Payer: Self-pay | Admitting: Cardiology

## 2022-11-18 ENCOUNTER — Ambulatory Visit: Payer: Medicaid Other | Attending: Cardiology | Admitting: Cardiology

## 2022-11-18 ENCOUNTER — Encounter: Payer: Self-pay | Admitting: Family Medicine

## 2022-11-18 ENCOUNTER — Ambulatory Visit (INDEPENDENT_AMBULATORY_CARE_PROVIDER_SITE_OTHER): Payer: Medicaid Other | Admitting: Family Medicine

## 2022-11-18 VITALS — BP 112/72 | HR 77 | Ht 64.0 in | Wt 166.0 lb

## 2022-11-18 VITALS — BP 120/70 | HR 86 | Temp 98.0°F | Ht 64.0 in | Wt 168.2 lb

## 2022-11-18 DIAGNOSIS — F339 Major depressive disorder, recurrent, unspecified: Secondary | ICD-10-CM | POA: Diagnosis not present

## 2022-11-18 DIAGNOSIS — R072 Precordial pain: Secondary | ICD-10-CM | POA: Diagnosis not present

## 2022-11-18 DIAGNOSIS — R079 Chest pain, unspecified: Secondary | ICD-10-CM | POA: Insufficient documentation

## 2022-11-18 DIAGNOSIS — E7800 Pure hypercholesterolemia, unspecified: Secondary | ICD-10-CM | POA: Diagnosis not present

## 2022-11-18 DIAGNOSIS — E7849 Other hyperlipidemia: Secondary | ICD-10-CM

## 2022-11-18 DIAGNOSIS — E559 Vitamin D deficiency, unspecified: Secondary | ICD-10-CM

## 2022-11-18 DIAGNOSIS — K5900 Constipation, unspecified: Secondary | ICD-10-CM

## 2022-11-18 DIAGNOSIS — R1084 Generalized abdominal pain: Secondary | ICD-10-CM | POA: Diagnosis not present

## 2022-11-18 DIAGNOSIS — B009 Herpesviral infection, unspecified: Secondary | ICD-10-CM

## 2022-11-18 MED ORDER — VALACYCLOVIR HCL 1 G PO TABS: 1000.0000 mg | ORAL_TABLET | Freq: Two times a day (BID) | ORAL | 0 refills | Status: AC

## 2022-11-18 MED ORDER — METOPROLOL TARTRATE 100 MG PO TABS: 100.0000 mg | ORAL_TABLET | Freq: Once | ORAL | 0 refills | Status: DC

## 2022-11-18 MED ORDER — NEXLIZET 180-10 MG PO TABS: 1.0000 | ORAL_TABLET | Freq: Every day | ORAL | 3 refills | Status: DC

## 2022-11-18 MED ORDER — ESCITALOPRAM OXALATE 10 MG PO TABS: 10.0000 mg | ORAL_TABLET | Freq: Every day | ORAL | 1 refills | Status: DC

## 2022-11-18 NOTE — Telephone Encounter (Signed)
PA submitted on CMM for Nexlizet 180-10 mg. Key [id number]

## 2022-11-18 NOTE — Progress Notes (Signed)
Cardiology Office Note:    Date:  11/18/2022   ID:  Sherri Wolf, DOB 11/09/1965, MRN [id number]  PCP:  Tawnya Crook, MD     Cardiologist:  Jenean Lindau, MD   Referring MD: Tawnya Crook, MD    ASSESSMENT:    1. Vitamin D deficiency   2. Familial hyperlipidemia   3. Chest pain of uncertain etiology    PLAN:    In order of problems listed above:  Primary prevention stressed with the patient.  Importance of compliance with diet medication stressed and she vocalized understanding. Chest pain: This will need to be evaluated in view of multiple risk factors especially familial hyperlipidemia.  Her LDL is close to 200.  She is on Crestor but she cannot increase it because of significant constipation.  She has some time to go to the emergency room for constipation issues.  For her chest pain I would like to do a CT coronary angiography with FFR if.  This will also help restratification and target her LDL. Familial hyperlipidemia: I will start her on Nexlizet at and she will be back in 6 weeks for liver lipid check.  Diet was emphasized. Patient will be seen in follow-up appointment in 6 months or earlier if the patient has any concerns    Medication Adjustments/Labs and Tests Ordered: Current medicines are reviewed at length with the patient today.  Concerns regarding medicines are outlined above.  No orders of the defined types were placed in this encounter.  No orders of the defined types were placed in this encounter.    History of Present Illness:    Sherri Wolf is a 57 y.o. female who is being seen today for the evaluation of chest pain at the request of Tawnya Crook, MD. patient is a pleasant 57 year old man.  She has past medical history of familial hyperlipidemia.  She mentions to me that she has chest pain.  Dyspnea or may not occur with exertion.  She does not exercise much because of orthopedic issues involving her right knee.  She has had interventions on it  in the past.  No orthopnea or PND.  At the time of my evaluation, the patient is alert awake oriented and in no distress.  Chest pain occasionally goes to the upper part and the shoulder on the left side.  Past Medical History:  Diagnosis Date   Acute non-recurrent maxillary sinusitis 09/08/2020   Bilateral carpal tunnel syndrome 10/28/2019   Colon polyp 10/27/2017   COVID-19 ruled out by laboratory testing 09/08/2020   Depression, recurrent ([place]) 10/21/2022   Edema leg 12/04/2021   Essential hypertension 05/28/2017   Formatting of this note is different from the original.  HTN, Started in June 2018  Had dry cough with Lisinopril.  Changed to Toprol XL 25 mg August 2018     Last Assessment & Plan:   Formatting of this note might be different from the original.  Assessment:   Adequately controlled on medical therapy.   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       Excessive weight gain 06/06/2015   Last Assessment & Plan: Formatting of this note might be different from the original. A/P Cause of recent sudden weight gain is difficult to pin point. All lab w/u is unremarkable for systemic disease like HTN, DM, Hypothyroidism, renal disease (Nephrotic syndrome). Lupus and other connective tissue disease should be considered. Adv To have  eval with internist and Rheumatologist.   Fibromyalgia 12/04/2021   Gastroesophageal reflux disease without esophagitis 12/04/2021   Hypercholesteremia 06/06/2015   Formatting of this note might be different from the original. Last Assessment & Plan: Assessment:  The condition is stable  No recent labs  Body mass index is 32.06 kg/m. Plan:  Continue dietary measures  Aim is to achieve LDL level  below 70  Continue regular exercise  Continue current medical management.  Check labs prior to next visit has a current medication list which includes the fo   Hyperlipidemia    Hypertension 2017   resolved   Left  ovarian cyst 10/27/2017   Low back pain 06/06/2015   Formatting of this note might be different from the original. Last Assessment & Plan: S: Still has right sided low back pain shooting down the leg. Also has rt shoulder pain which limits her arm movements. A/P Rec: Ortho eval, to r/o spinal stenosis. Last Assessment & Plan: Formatting of this note might be different from the original. S: Still has right sided low back pain shooting down the leg. A   Lower respiratory infection 09/08/2020   Migraine 10/27/2017   Formatting of this note might be different from the original. hx migraines, mri-mild periventricular white matter ischemic changes- 2018 June Formatting of this note might be different from the original. hx migraines, mri-mild periventricular white matter ischemic changes- 2018 June   Neck pain 10/28/2019   Numbness 10/28/2019   Obstructive sleep apnea (adult) (pediatric) 12/04/2021   Other chest pain 06/06/2015   Formatting of this note might be different from the original. 06/06/2015 EST echo 4 mins standard Bruce protocol. No CP Last Assessment & Plan: Formatting of this note might be different from the original. S: No cp. Still has shortness of breath while walking. A/P ReC: Pul evaluation, PFT,  to r/o asthma/ pul fibrosis.Echo is normal with no pulmonary HTN   Other specified postprocedural states 10/27/2017   Formatting of this note might be different from the original. hx of congential vaginal atresia   Pain in right knee 04/30/2018   Peripheral edema 01/07/2021   Persistent cough 03/02/2020   Prediabetes 07/01/2019   Primary osteoarthritis of both first carpometacarpal joints 10/28/2019   Pruritus 12/04/2021   Right ovarian cyst 10/27/2017   Trigger middle finger of left hand 10/28/2019   Vitamin D deficiency 12/30/2019    Past Surgical History:  Procedure Laterality Date   TOTAL VAGINAL HYSTERECTOMY  2019    Current Medications: Current Meds  Medication Sig    azelastine (ASTELIN) 0.1 % nasal spray Place 2 sprays into both nostrils 2 (two) times daily.   cyanocobalamin (VITAMIN B12) 1000 MCG tablet Take 1 tablet by mouth daily.   escitalopram (LEXAPRO) 10 MG tablet Take 1 tablet (10 mg total) by mouth daily.   hydrOXYzine (ATARAX) 25 MG tablet TAKE 1 TABLET (25 MG TOTAL) BY MOUTH AS NEEDED.   methocarbamol (ROBAXIN) 500 MG tablet Take 1 tablet (500 mg total) by mouth every 8 (eight) hours as needed for muscle spasms.   Multiple Vitamins-Minerals (ONCOVITE) TABS Take 1 capsule by mouth daily.   pantoprazole (PROTONIX) 20 MG tablet Take 1 tablet (20 mg total) by mouth 2 (two) times daily before a meal. Take 1 pill twice a day for 2 weeks, then 1 pill every morning.   polyethylene glycol powder (GLYCOLAX/MIRALAX) 17 GM/SCOOP powder Take 17 g by mouth as needed for mild constipation or moderate constipation.   rosuvastatin (CRESTOR)  20 MG tablet Take 1 tablet (20 mg total) by mouth daily.   Vitamin D, Ergocalciferol, (DRISDOL) 1.25 MG (50000 UNIT) CAPS capsule Take 1 capsule (50,000 Units total) by mouth every 7 (seven) days.     Allergies:   Patient has no known allergies.   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    Step son and dau.  Grands-2    Social Determinants of Health   Financial Resource Strain: Not on file  Food Insecurity: Not on file  Transportation Needs: Not on file  Physical Activity: Not on file  Stress: Not on file   Social Connections: Not on file     Family History: The patient's family history includes Hyperlipidemia in her father; Hypertension in her father and mother; Lung disease in her father.  ROS:   Please see the history of present illness.    All other systems reviewed and are negative.  EKGs/Labs/Other Studies Reviewed:    The following studies were reviewed today: EKG reveals sinus rhythm and nonspecific ST-T changes   Recent Labs: 12/31/2021: Magnesium 2.0 01/14/2022: TSH 1.85 10/21/2022: ALT 13; BUN 8; Creatinine, Ser 0.73; Hemoglobin 12.2; Platelets 369.0; Potassium 5.1; Sodium 143  Recent Lipid Panel    Component Value Date/Time   CHOL 282 (H) 10/21/2022 1130   TRIG 189.0 (H) 10/21/2022 1130   HDL 47.90 10/21/2022 1130   CHOLHDL 6 10/21/2022 1130   VLDL 37.8 10/21/2022 1130   LDLCALC 197 (H) 10/21/2022 1130   LDLDIRECT 219.0 06/30/2022 1508    Physical Exam:    VS:  BP 112/72   Pulse 77   Ht '5\' 4"'$  (1.626 m)   Wt 166 lb 0.6 oz (75.3 kg)   SpO2 98%   BMI 28.50 kg/m     Wt Readings from Last 3 Encounters:  11/18/22 166 lb 0.6 oz (75.3 kg)  10/21/22 165 lb 9.6 oz (75.1 kg)  08/13/22 166 lb 4 oz (75.4 kg)     GEN: Patient is in no acute distress HEENT: Normal NECK: No JVD; No carotid bruits LYMPHATICS: No lymphadenopathy CARDIAC: S1 S2 regular, 2/6 systolic murmur at the apex. RESPIRATORY:  Clear to auscultation without rales, wheezing or rhonchi  ABDOMEN: Soft, non-tender, non-distended MUSCULOSKELETAL:  No edema; No deformity  SKIN: Warm and dry NEUROLOGIC:  Alert and oriented x 3 PSYCHIATRIC:  Normal affect    Signed, Jenean Lindau, MD  11/18/2022 9:03 AM    [place]

## 2022-11-18 NOTE — Patient Instructions (Addendum)
Let me know in few weeks if spot still there on buttocks.

## 2022-11-18 NOTE — Patient Instructions (Signed)
Medication Instructions:  Your physician has recommended you make the following change in your medication:   Start Nexlizet 1 tablet daily.  *If you need a refill on your cardiac medications before your next appointment, please call your pharmacy*   Lab Work: Your physician recommends that you return for lab work in: 6 weeks for CMP and Lipids. You need to have labs done when you are fasting. [facility] lab is located on the 3rd floor, [address]. Hours are M-F 8 am-4 pm, closed 11:30 am-1:00 pm. You do not need an appointment.  If you have labs (blood work) drawn today and your tests are completely normal, you will receive your results only by: [place] (if you have MyChart) OR A paper copy in the mail If you have any lab test that is abnormal or we need to change your treatment, we will call you to review the results.   Testing/Procedures:   Your cardiac CT will be scheduled at one of the below locations:   [facility] [address] [place], [place] [address] [phone]  If scheduled at [facility], please arrive at the [facility] Entrance (Entrance C2) of [facility] 30 minutes prior to test start time. You can use the FREE valet parking offered at entrance C (encouraged to control the heart rate for the test)  Proceed to the [facility] Radiology Department (first floor) to check-in and test prep.  All radiology patients and guests should use entrance C2 at [facility], accessed from [facility], even though the hospital's physical address listed is [address]     Please follow these instructions carefully (unless otherwise directed):  Hold all erectile dysfunction medications at least 3 days (72 hrs) prior to test. (Ie viagra, cialis, sildenafil, tadalafil, etc) We will administer nitroglycerin during this exam.   On the Night Before the Test: Be sure to Drink plenty of  water. Do not consume any caffeinated/decaffeinated beverages or chocolate 12 hours prior to your test. Do not take any antihistamines 12 hours prior to your test.   On the Day of the Test: Drink plenty of water until 1 hour prior to the test. Do not eat any food 1 hour prior to test. You may take your regular medications prior to the test.  Take metoprolol (Lopressor) two hours prior to test. This will be a one time dose.       After the Test: Drink plenty of water. After receiving IV contrast, you may experience a mild flushed feeling. This is normal. On occasion, you may experience a mild rash up to 24 hours after the test. This is not dangerous. If this occurs, you can take Benadryl 25 mg and increase your fluid intake. If you experience trouble breathing, this can be serious. If it is severe call 911 IMMEDIATELY. If it is mild, please call our office. If you take any of these medications: Glipizide/Metformin, Avandament, Glucavance, please do not take 48 hours after completing test unless otherwise instructed.  We will call to schedule your test 2-4 weeks out understanding that some insurance companies will need an authorization prior to the service being performed.   For non-scheduling related questions, please contact the cardiac imaging nurse navigator should you have any questions/concerns: Marchia Bond, Cardiac Imaging Nurse Navigator Gordy Clement, Cardiac Imaging Nurse Navigator [facility] Direct Office Dial: [phone]   For scheduling needs, including cancellations and rescheduling, please  call [place], [phone].   Your next appointment:   9 month(s)  The format for your next appointment:   In Person  Provider:   Jyl Heinz, MD   Other Instructions Cardiac CT Angiogram A cardiac CT angiogram is a procedure to look at the heart and the area around the heart. It may be done to help find the cause of chest pains or other  symptoms of heart disease. During this procedure, a substance called contrast dye is injected into the blood vessels in the area to be checked. A large X-ray machine, called a CT scanner, then takes detailed pictures of the heart and the surrounding area. The procedure is also sometimes called a coronary CT angiogram, coronary artery scanning, or CTA. A cardiac CT angiogram allows the health care provider to see how well blood is flowing to and from the heart. The health care provider will be able to see if there are any problems, such as: Blockage or narrowing of the coronary arteries in the heart. Fluid around the heart. Signs of weakness or disease in the muscles, valves, and tissues of the heart. Tell a health care provider about: Any allergies you have. This is especially important if you have had a previous allergic reaction to contrast dye. All medicines you are taking, including vitamins, herbs, eye drops, creams, and over-the-counter medicines. Any blood disorders you have. Any surgeries you have had. Any medical conditions you have. Whether you are pregnant or may be pregnant. Any anxiety disorders, chronic pain, or other conditions you have that may increase your stress or prevent you from lying still. What are the risks? Generally, this is a safe procedure. However, problems may occur, including: Bleeding. Infection. Allergic reactions to medicines or dyes. Damage to other structures or organs. Kidney damage from the contrast dye that is used. Increased risk of cancer from radiation exposure. This risk is low. Talk with your health care provider about: The risks and benefits of testing. How you can receive the lowest dose of radiation. What happens before the procedure? Wear comfortable clothing and remove any jewelry, glasses, dentures, and hearing aids. Follow instructions from your health care provider about eating and drinking. This may include: For 12 hours before the  procedure -- avoid caffeine. This includes tea, coffee, soda, energy drinks, and diet pills. Drink plenty of water or other fluids that do not have caffeine in them. Being well hydrated can prevent complications. For 4-6 hours before the procedure -- stop eating and drinking. The contrast dye can cause nausea, but this is less likely if your stomach is empty. Ask your health care provider about changing or stopping your regular medicines. This is especially important if you are taking diabetes medicines, blood thinners, or medicines to treat problems with erections (erectile dysfunction). What happens during the procedure?  Hair on your chest may need to be removed so that small sticky patches called electrodes can be placed on your chest. These will transmit information that helps to monitor your heart during the procedure. An IV will be inserted into one of your veins. You might be given a medicine to control your heart rate during the procedure. This will help to ensure that good images are obtained. You will be asked to lie on an exam table. This table will slide in and out of the CT machine during the procedure. Contrast dye will be injected into the IV. You might feel warm, or you may get a metallic taste in your mouth. You  will be given a medicine called nitroglycerin. This will relax or dilate the arteries in your heart. The table that you are lying on will move into the CT machine tunnel for the scan. The person running the machine will give you instructions while the scans are being done. You may be asked to: Keep your arms above your head. Hold your breath. Stay very still, even if the table is moving. When the scanning is complete, you will be moved out of the machine. The IV will be removed. The procedure may vary among health care providers and hospitals. What can I expect after the procedure? After your procedure, it is common to have: A metallic taste in your mouth from the  contrast dye. A feeling of warmth. A headache from the nitroglycerin. Follow these instructions at home: Take over-the-counter and prescription medicines only as told by your health care provider. If you are told, drink enough fluid to keep your urine pale yellow. This will help to flush the contrast dye out of your body. Most people can return to their normal activities right after the procedure. Ask your health care provider what activities are safe for you. It is up to you to get the results of your procedure. Ask your health care provider, or the department that is doing the procedure, when your results will be ready. Keep all follow-up visits as told by your health care provider. This is important. Contact a health care provider if: You have any symptoms of allergy to the contrast dye. These include: Shortness of breath. Rash or hives. A racing heartbeat. Summary A cardiac CT angiogram is a procedure to look at the heart and the area around the heart. It may be done to help find the cause of chest pains or other symptoms of heart disease. During this procedure, a large X-ray machine, called a CT scanner, takes detailed pictures of the heart and the surrounding area after a contrast dye has been injected into blood vessels in the area. Ask your health care provider about changing or stopping your regular medicines before the procedure. This is especially important if you are taking diabetes medicines, blood thinners, or medicines to treat erectile dysfunction. If you are told, drink enough fluid to keep your urine pale yellow. This will help to flush the contrast dye out of your body. This information is not intended to replace advice given to you by your health care provider. Make sure you discuss any questions you have with your health care provider. Document Revised: 05/04/2019 Document Reviewed: 05/04/2019 Elsevier Patient Education  [place].

## 2022-11-18 NOTE — Progress Notes (Signed)
Subjective:     Patient ID: Sherri Wolf, female    DOB: 11-06-1965, 57 y.o.   MRN: [id number]  Chief Complaint  Patient presents with   Follow-up    1 month follow-up for moods Check area on upper buttocks, itching    HPI  Depression-doing great on lexapro and no SI and no changes needed. HLD-taking meds regularly now.  Saw Card. Will get CTA 2 days itching a lot on buttock-lesion.  Never had before.  GI-receptionist said no to taking as pt as saw GI in [place] in past    In [place].  Now [place].  Ins change.  OSA-machine 2018 in [place].  Then sleep study in 2021.  Needs parts.   Health Maintenance Due  Topic Date Due   HIV Screening  Never done   Hepatitis C Screening  Never done    Past Medical History:  Diagnosis Date   Acute non-recurrent maxillary sinusitis 09/08/2020   Bilateral carpal tunnel syndrome 10/28/2019   Colon polyp 10/27/2017   COVID-19 ruled out by laboratory testing 09/08/2020   Depression, recurrent ([place]) 10/21/2022   Edema leg 12/04/2021   Essential hypertension 05/28/2017   Formatting of this note is different from the original.  HTN, Started in June 2018  Had dry cough with Lisinopril.  Changed to Toprol XL 25 mg August 2018     Last Assessment & Plan:   Formatting of this note might be different from the original.  Assessment:   Adequately controlled on medical therapy.   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       Excessive weight gain 06/06/2015   Last Assessment & Plan: Formatting of this note might be different from the original. A/P Cause of recent sudden weight gain is difficult to pin point. All lab w/u is unremarkable for systemic disease like HTN, DM, Hypothyroidism, renal disease (Nephrotic syndrome). Lupus and other connective tissue disease should be considered. Adv To have eval with internist and Rheumatologist.   Fibromyalgia 12/04/2021   Gastroesophageal reflux disease without esophagitis 12/04/2021    Hypercholesteremia 06/06/2015   Formatting of this note might be different from the original. Last Assessment & Plan: Assessment:  The condition is stable  No recent labs  Body mass index is 32.06 kg/m. Plan:  Continue dietary measures  Aim is to achieve LDL level  below 70  Continue regular exercise  Continue current medical management.  Check labs prior to next visit has a current medication list which includes the fo   Hyperlipidemia    Hypertension 2017   resolved   Left ovarian cyst 10/27/2017   Low back pain 06/06/2015   Formatting of this note might be different from the original. Last Assessment & Plan: S: Still has right sided low back pain shooting down the leg. Also has rt shoulder pain which limits her arm movements. A/P Rec: Ortho eval, to r/o spinal stenosis. Last Assessment & Plan: Formatting of this note might be different from the original. S: Still has right sided low back pain shooting down the leg. A   Lower respiratory infection 09/08/2020   Migraine 10/27/2017   Formatting of this note might be different from the original. hx migraines, mri-mild periventricular white matter ischemic changes- 2018 June Formatting of this note might be different from the original. hx migraines, mri-mild periventricular white matter ischemic changes- 2018 June   Neck pain 10/28/2019   Numbness 10/28/2019  Obstructive sleep apnea (adult) (pediatric) 12/04/2021   Other chest pain 06/06/2015   Formatting of this note might be different from the original. 06/06/2015 EST echo 4 mins standard Bruce protocol. No CP Last Assessment & Plan: Formatting of this note might be different from the original. S: No cp. Still has shortness of breath while walking. A/P ReC: Pul evaluation, PFT,  to r/o asthma/ pul fibrosis.Echo is normal with no pulmonary HTN   Other specified postprocedural states 10/27/2017   Formatting of this note might be different from the original. hx of congential vaginal atresia    Pain in right knee 04/30/2018   Peripheral edema 01/07/2021   Persistent cough 03/02/2020   Prediabetes 07/01/2019   Primary osteoarthritis of both first carpometacarpal joints 10/28/2019   Pruritus 12/04/2021   Right ovarian cyst 10/27/2017   Trigger middle finger of left hand 10/28/2019   Vitamin D deficiency 12/30/2019    Past Surgical History:  Procedure Laterality Date   TOTAL VAGINAL HYSTERECTOMY  2019    Outpatient Medications Prior to Visit  Medication Sig Dispense Refill   azelastine (ASTELIN) 0.1 % nasal spray Place 2 sprays into both nostrils 2 (two) times daily. 30 mL 12   Bempedoic Acid-Ezetimibe (NEXLIZET) 180-10 MG TABS Take 1 tablet by mouth daily in the afternoon. 90 tablet 3   cyanocobalamin (VITAMIN B12) 1000 MCG tablet Take 1 tablet by mouth daily.     hydrOXYzine (ATARAX) 25 MG tablet TAKE 1 TABLET (25 MG TOTAL) BY MOUTH AS NEEDED. 90 tablet 1   methocarbamol (ROBAXIN) 500 MG tablet Take 1 tablet (500 mg total) by mouth every 8 (eight) hours as needed for muscle spasms. 30 tablet 3   metoprolol tartrate (LOPRESSOR) 100 MG tablet Take 1 tablet (100 mg total) by mouth once for 1 dose. Take 2 hours prior to your CT if your heart rate is greater than 55 1 tablet 0   Multiple Vitamins-Minerals (ONCOVITE) TABS Take 1 capsule by mouth daily.     pantoprazole (PROTONIX) 20 MG tablet Take 1 tablet (20 mg total) by mouth 2 (two) times daily before a meal. Take 1 pill twice a day for 2 weeks, then 1 pill every morning. 180 tablet 0   polyethylene glycol powder (GLYCOLAX/MIRALAX) 17 GM/SCOOP powder Take 17 g by mouth as needed for mild constipation or moderate constipation.     rosuvastatin (CRESTOR) 20 MG tablet Take 1 tablet (20 mg total) by mouth daily. 90 tablet 3   Vitamin D, Ergocalciferol, (DRISDOL) 1.25 MG (50000 UNIT) CAPS capsule Take 1 capsule (50,000 Units total) by mouth every 7 (seven) days. 12 capsule 0   escitalopram (LEXAPRO) 10 MG tablet Take 1 tablet (10 mg  total) by mouth daily. 30 tablet 1   No facility-administered medications prior to visit.    No Known Allergies ROS neg/noncontributory except as noted HPI/below      Objective:     BP 120/70   Pulse 86   Temp 98 F (36.7 C) (Temporal)   Ht '5\' 4"'$  (1.626 m)   Wt 168 lb 4 oz (76.3 kg)   SpO2 99%   BMI 28.88 kg/m  Wt Readings from Last 3 Encounters:  11/18/22 168 lb 4 oz (76.3 kg)  11/18/22 166 lb 0.6 oz (75.3 kg)  10/21/22 165 lb 9.6 oz (75.1 kg)    Physical Exam   Gen: WDWN NAD HEENT: NCAT, conjunctiva not injected, sclera nonicteric Abd: tender L side MSK: no gross abnormalities.  NEURO: A&O x3.  CN II-XII intact.  PSYCH: normal mood. Good eye contact Gluteal cleft-L-approx 4-7m cluster vessicles.  Very tender.  Has vasaline in cleft and tender so didn't wipe all away     Assessment & Plan:   Problem List Items Addressed This Visit       Other   Depression, recurrent ([place] - [facility]   Relevant Medications   escitalopram (LEXAPRO) 10 MG tablet   Hypercholesteremia   Other Visit Diagnoses     HSV (herpes simplex virus) infection       Relevant Medications   valACYclovir (VALTREX) 1000 MG tablet   Generalized abdominal pain       Constipation, unspecified constipation type         1.  Depression-recurrent.  Well-controlled.  Continue Lexapro 10 mg daily.  Follow-up 6 months 2.  Hyperlipidemia-chronic.  Not controlled.  She was not taking her medication regularly.  She now is.  She saw cardiology.  They added nexlizet-repeat labs approximately 6 weeks. 3.  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  Valtrex 1000 mg twice daily x 10 days.  Advised to monitor closely.  If does not resolve, consider other etiology. 4.  Generalized abdominal pain, chronic constipation-[place]  [place]  [facility]  I will contact GI  Meds  ordered this encounter  Medications   escitalopram (LEXAPRO) 10 MG tablet    Sig: Take 1 tablet (10 mg total) by mouth daily.    Dispense:  90 tablet    Refill:  1   valACYclovir (VALTREX) 1000 MG tablet    Sig: Take 1 tablet (1,000 mg total) by mouth 2 (two) times daily for 10 days.    Dispense:  20 tablet    Refill:  0    AWellington Hampshire MD

## 2022-11-19 NOTE — Telephone Encounter (Signed)
PA approved on CMM for Nexlizet through 11/19/2023.

## 2022-11-26 ENCOUNTER — Telehealth (HOSPITAL_COMMUNITY): Payer: Self-pay | Admitting: Emergency Medicine

## 2022-11-26 NOTE — Telephone Encounter (Signed)
Reaching out to patient to offer assistance regarding upcoming cardiac imaging study; pt verbalizes understanding of appt date/time, parking situation and where to check in, pre-test NPO status and medications ordered, and verified current allergies; name and call back number provided for further questions should they arise Sherri Bond RN Navigator Cardiac Imaging [facility] [phone] office [phone] cell  Arrival [address] Denies iv issues '100mg'$  metoprolol tartrate Aware contrast.nitro Coming via medicaid transportation

## 2022-11-27 ENCOUNTER — Ambulatory Visit (HOSPITAL_COMMUNITY)
Admission: RE | Admit: 2022-11-27 | Discharge: 2022-11-27 | Disposition: A | Discharge status: Home / Self Care | Payer: Medicaid Other | Source: Ambulatory Visit | Attending: Cardiology | Admitting: Cardiology

## 2022-11-27 DIAGNOSIS — R072 Precordial pain: Secondary | ICD-10-CM | POA: Diagnosis not present

## 2022-11-27 DIAGNOSIS — R079 Chest pain, unspecified: Secondary | ICD-10-CM | POA: Insufficient documentation

## 2022-11-27 MED ORDER — IOHEXOL 350 MG/ML SOLN
100.0000 mL | Freq: Once | INTRAVENOUS | Status: AC | PRN
  Administered 2022-11-27: 100 mL via INTRAVENOUS

## 2022-11-27 MED ORDER — NITROGLYCERIN 0.4 MG SL SUBL
SUBLINGUAL_TABLET | SUBLINGUAL | Status: AC
  Filled 2022-11-27: qty 2

## 2022-11-27 MED ORDER — NITROGLYCERIN 0.4 MG SL SUBL
0.8000 mg | SUBLINGUAL_TABLET | Freq: Once | SUBLINGUAL | Status: AC
  Administered 2022-11-27: 0.8 mg via SUBLINGUAL

## 2023-01-11 ENCOUNTER — Other Ambulatory Visit: Payer: Self-pay | Admitting: Family Medicine

## 2023-01-16 ENCOUNTER — Other Ambulatory Visit: Payer: Self-pay | Admitting: Family Medicine

## 2023-01-16 DIAGNOSIS — R051 Acute cough: Secondary | ICD-10-CM

## 2023-02-20 ENCOUNTER — Encounter: Payer: Self-pay | Admitting: Physician Assistant

## 2023-02-20 ENCOUNTER — Ambulatory Visit (INDEPENDENT_AMBULATORY_CARE_PROVIDER_SITE_OTHER): Payer: Medicaid Other | Admitting: Physician Assistant

## 2023-02-20 VITALS — BP 118/70 | HR 77 | Temp 97.5°F | Ht 64.0 in | Wt 173.0 lb

## 2023-02-20 DIAGNOSIS — K5900 Constipation, unspecified: Secondary | ICD-10-CM | POA: Diagnosis not present

## 2023-02-20 DIAGNOSIS — R140 Abdominal distension (gaseous): Secondary | ICD-10-CM | POA: Diagnosis not present

## 2023-02-20 NOTE — Patient Instructions (Signed)
It was great to see you!  We will send new referral to [facility] gastroenterology  We will test for celiacs  Consider IBGard     Take care,  Jarold Motto PA-C

## 2023-02-20 NOTE — Progress Notes (Signed)
Sherri Wolf is a 57 y.o. female here for a new problem.  History of Present Illness:   Chief Complaint  Patient presents with   Abdominal Pain    Pt c/o abdominal pain and bloating, x 10 days.    HPI  Abdominal pain Patient is complaining of abdominal pain and bloating occurring for 10 days. She states that after eating she experiences pain on the left side of her abdomen. She suspects that she may have a food allergy. Patient mentions that when she eats raw fruits such as watermelon or items like salads, she becomes bloated and gassy. Patient confirms itching on her legs and chest, but denies any rashes, rectal bleeding, and nausea. She manages sxs with miralax and flax seed oil. Patient also mentions that she doesn't use milk substitutes.   Has seen gastroenterology in the past but not recently due to insurance. Has had colonoscopy and this was reportedly normal.  Past Medical History:  Diagnosis Date   Acute non-recurrent maxillary sinusitis 09/08/2020   Bilateral carpal tunnel syndrome 10/28/2019   Colon polyp 10/27/2017   COVID-19 ruled out by laboratory testing 09/08/2020   Depression, recurrent ([facility]) 10/21/2022   Edema leg 12/04/2021   Essential hypertension 05/28/2017   Formatting of this note is different from the original.  HTN, Started in June 2018  Had dry cough with Lisinopril.  Changed to Toprol XL 25 mg August 2018     Last Assessment & Plan:   Formatting of this note might be different from the original.  Assessment:   Adequately controlled on medical therapy.   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       Excessive weight gain 06/06/2015   Last Assessment & Plan: Formatting of this note might be different from the original. A/P Cause of recent sudden weight gain is difficult to pin point. All lab w/u is unremarkable for systemic disease like HTN, DM, Hypothyroidism, renal disease (Nephrotic syndrome). Lupus and other  connective tissue disease should be considered. Adv To have eval with internist and Rheumatologist.   Fibromyalgia 12/04/2021   Gastroesophageal reflux disease without esophagitis 12/04/2021   Hypercholesteremia 06/06/2015   Formatting of this note might be different from the original. Last Assessment & Plan: Assessment:  The condition is stable  No recent labs  Body mass index is 32.06 kg/m. Plan:  Continue dietary measures  Aim is to achieve LDL level  below 70  Continue regular exercise  Continue current medical management.  Check labs prior to next visit has a current medication list which includes the fo   Hyperlipidemia    Hypertension 2017   resolved   Left ovarian cyst 10/27/2017   Low back pain 06/06/2015   Formatting of this note might be different from the original. Last Assessment & Plan: S: Still has right sided low back pain shooting down the leg. Also has rt shoulder pain which limits her arm movements. A/P Rec: Ortho eval, to r/o spinal stenosis. Last Assessment & Plan: Formatting of this note might be different from the original. S: Still has right sided low back pain shooting down the leg. A   Lower respiratory infection 09/08/2020   Migraine 10/27/2017   Formatting of this note might be different from the original. hx migraines, mri-mild periventricular white matter ischemic changes- 2018 June Formatting of this note might be different from the original. hx migraines, mri-mild periventricular white matter ischemic changes- 2018 June  Neck pain 10/28/2019   Numbness 10/28/2019   Obstructive sleep apnea (adult) (pediatric) 12/04/2021   Other chest pain 06/06/2015   Formatting of this note might be different from the original. 06/06/2015 EST echo 4 mins standard Bruce protocol. No CP Last Assessment & Plan: Formatting of this note might be different from the original. S: No cp. Still has shortness of breath while walking. A/P ReC: Pul evaluation, PFT,  to r/o asthma/ pul  fibrosis.Echo is normal with no pulmonary HTN   Other specified postprocedural states 10/27/2017   Formatting of this note might be different from the original. hx of congential vaginal atresia   Pain in right knee 04/30/2018   Peripheral edema 01/07/2021   Persistent cough 03/02/2020   Prediabetes 07/01/2019   Primary osteoarthritis of both first carpometacarpal joints 10/28/2019   Pruritus 12/04/2021   Right ovarian cyst 10/27/2017   Trigger middle finger of left hand 10/28/2019   Vitamin D deficiency 12/30/2019     Social History   Tobacco Use   Smoking status: Never   Smokeless tobacco: Never  Substance Use Topics   Alcohol use: Never    Past Surgical History:  Procedure Laterality Date   TOTAL VAGINAL HYSTERECTOMY  2019    Family History  Problem Relation Age of Onset   Hypertension Mother    Hyperlipidemia Father    Hypertension Father    Lung disease Father     No Known Allergies  Current Medications:   Current Outpatient Medications:    azelastine (ASTELIN) 0.1 % nasal spray, Place 2 sprays into both nostrils 2 (two) times daily., Disp: 30 mL, Rfl: 12   Bempedoic Acid-Ezetimibe (NEXLIZET) 180-10 MG TABS, Take 1 tablet by mouth daily in the afternoon., Disp: 90 tablet, Rfl: 3   cyanocobalamin (VITAMIN B12) 1000 MCG tablet, Take 1 tablet by mouth daily., Disp: , Rfl:    escitalopram (LEXAPRO) 10 MG tablet, Take 1 tablet (10 mg total) by mouth daily., Disp: 90 tablet, Rfl: 1   hydrOXYzine (ATARAX) 25 MG tablet, TAKE 1 TABLET (25 MG TOTAL) BY MOUTH AS NEEDED., Disp: 90 tablet, Rfl: 1   methocarbamol (ROBAXIN) 500 MG tablet, Take 1 tablet (500 mg total) by mouth every 8 (eight) hours as needed for muscle spasms., Disp: 30 tablet, Rfl: 3   Multiple Vitamins-Minerals (ONCOVITE) TABS, Take 1 capsule by mouth daily., Disp: , Rfl:    pantoprazole (PROTONIX) 20 MG tablet, TAKE 1 TABLET (20 MG TOTAL) BY MOUTH 2 (TWO) TIMES DAILY BEFORE A MEAL. TAKE 1 PILL TWICE A DAY FOR 2  WEEKS, THEN 1 PILL EVERY MORNING., Disp: 180 tablet, Rfl: 0   polyethylene glycol powder (GLYCOLAX/MIRALAX) 17 GM/SCOOP powder, Take 17 g by mouth as needed for mild constipation or moderate constipation., Disp: , Rfl:    rosuvastatin (CRESTOR) 20 MG tablet, Take 1 tablet (20 mg total) by mouth daily., Disp: 90 tablet, Rfl: 3   Vitamin D, Ergocalciferol, (DRISDOL) 1.25 MG (50000 UNIT) CAPS capsule, TAKE 1 CAPSULE (50,000 UNITS TOTAL) BY MOUTH EVERY 7 (SEVEN) DAYS, Disp: 12 capsule, Rfl: 0   Review of Systems:   Review of Systems  Gastrointestinal:  Positive for abdominal pain.    Vitals:   Vitals:   02/20/23 0951  BP: 118/70  Pulse: 77  Temp: (!) 97.5 F (36.4 C)  TempSrc: Temporal  SpO2: 98%  Weight: 173 lb (78.5 kg)  Height: 5\' 4"  (1.626 m)     Body mass index is 29.7 kg/m.  Physical Exam:  Physical Exam Vitals and nursing note reviewed.  Constitutional:      General: She is not in acute distress.    Appearance: Normal appearance. She is well-developed. She is not ill-appearing or toxic-appearing.  HENT:     Head: Normocephalic and atraumatic.     Right Ear: External ear normal.     Left Ear: External ear normal.  Eyes:     Extraocular Movements: Extraocular movements intact.     Pupils: Pupils are equal, round, and reactive to light.  Cardiovascular:     Rate and Rhythm: Normal rate and regular rhythm.     Pulses: Normal pulses.     Heart sounds: Normal heart sounds, S1 normal and S2 normal. No murmur heard.    No gallop.  Pulmonary:     Effort: Pulmonary effort is normal. No respiratory distress.     Breath sounds: Normal breath sounds. No wheezing or rales.  Abdominal:     General: Abdomen is flat. Bowel sounds are normal.     Palpations: Abdomen is soft.     Tenderness: There is no abdominal tenderness.  Skin:    General: Skin is warm and dry.  Neurological:     Mental Status: She is alert and oriented to person, place, and time.     GCS: GCS eye  subscore is 4. GCS verbal subscore is 5. GCS motor subscore is 6.  Psychiatric:        Speech: Speech normal.        Behavior: Behavior normal. Behavior is cooperative.        Judgment: Judgment normal.     Assessment and Plan:   Constipation, unspecified constipation type; Abdominal bloating No red flags No evidence of acute abdomen warranting abdomen imaging Suspect IBS with constipation New referral to gastroenterology placed for patient today Recommend IBGard, trial of Low Fermentable, Oligo-saccharides, Di-saccharides, Mono-saccharides, and Polyols diet, trial of milk substitute Will do celiac testing however I did discuss that she may still have gluten sensitivity if this is negative   I,Verona Buck,acting as a Neurosurgeon for [employer], PA.,have documented all relevant documentation on the behalf of Sherri Motto, PA,as directed by  Sherri Motto, PA while in the presence of Sherri Wolf, [place].  I, Sherri Wolf, [place], have reviewed all documentation for this visit. The documentation on 02/20/23 for the exam, diagnosis, procedures, and orders are all accurate and complete.   Sherri Motto, PA-C

## 2023-02-23 LAB — CELIAC DISEASE AB SCREEN W/RFX
Antigliadin Abs, IgA: 4 units (ref 0–19)
IgA/Immunoglobulin A, Serum: 232 mg/dL (ref 87–352)
Transglutaminase IgA: <2 U/mL (ref 0–3)

## 2023-03-20 ENCOUNTER — Ambulatory Visit: Payer: Medicaid Other | Admitting: Family Medicine

## 2023-03-22 ENCOUNTER — Other Ambulatory Visit: Payer: Self-pay | Admitting: Family Medicine

## 2023-03-22 NOTE — Telephone Encounter (Signed)
Missed appt-needs to schedule

## 2023-03-23 NOTE — Telephone Encounter (Signed)
Called and informed pt that an OV is needed for refill. I offerefd pt an OV for 7/2 @ 3pm but she declined. States she is missed appointment due to falling ill and can't r/s due to visiting home country until December to see her mother.

## 2023-04-08 ENCOUNTER — Other Ambulatory Visit: Payer: Self-pay | Admitting: Family Medicine

## 2023-04-08 DIAGNOSIS — R051 Acute cough: Secondary | ICD-10-CM

## 2023-11-03 ENCOUNTER — Ambulatory Visit (INDEPENDENT_AMBULATORY_CARE_PROVIDER_SITE_OTHER): Payer: Medicaid Other | Admitting: Family Medicine

## 2023-11-03 VITALS — BP 123/82 | HR 87 | Temp 97.5°F | Resp 16 | Ht 64.0 in | Wt 182.4 lb

## 2023-11-03 DIAGNOSIS — Z0000 Encounter for general adult medical examination without abnormal findings: Secondary | ICD-10-CM | POA: Diagnosis not present

## 2023-11-03 DIAGNOSIS — G4733 Obstructive sleep apnea (adult) (pediatric): Secondary | ICD-10-CM | POA: Diagnosis not present

## 2023-11-03 DIAGNOSIS — Z1159 Encounter for screening for other viral diseases: Secondary | ICD-10-CM | POA: Diagnosis not present

## 2023-11-03 DIAGNOSIS — M542 Cervicalgia: Secondary | ICD-10-CM

## 2023-11-03 DIAGNOSIS — R351 Nocturia: Secondary | ICD-10-CM

## 2023-11-03 LAB — POC URINALSYSI DIPSTICK (AUTOMATED)
Bilirubin, UA: NEGATIVE
Blood, UA: NEGATIVE
Glucose, UA: NEGATIVE
Ketones, UA: NEGATIVE
Leukocytes, UA: NEGATIVE
Nitrite, UA: NEGATIVE
Protein, UA: NEGATIVE
Spec Grav, UA: 1.025 (ref 1.010–1.025)
Urobilinogen, UA: 0.2 U/dL
pH, UA: 5.5 (ref 5.0–8.0)

## 2023-11-03 NOTE — Patient Instructions (Signed)
It was very nice to see you today!  Do the [facility] exercises.  Go to the bathroom more frequently   PLEASE NOTE:  If you had any lab tests please let us know if you have not heard back within a few days. You may see your results on MyChart before we have a chance to review them but we will give you a call once they are reviewed by [place]. If we ordered any referrals today, please let us know if you have not heard from their office within the next week.   Please try these tips to maintain a healthy lifestyle:  Eat most of your calories during the day when you are active. Eliminate processed foods including packaged sweets (pies, cakes, cookies), reduce intake of potatoes, white bread, white pasta, and white rice. Look for whole grain options, oat flour or almond flour.  Each meal should contain half fruits/vegetables, one quarter protein, and one quarter carbs (no bigger than a computer mouse).  Cut down on sweet beverages. This includes juice, soda, and sweet tea. Also watch fruit intake, though this is a healthier sweet option, it still contains natural sugar! Limit to 3 servings daily.  Drink at least 1 glass of water with each meal and aim for at least 8 glasses per day  Exercise at least 150 minutes every week.

## 2023-11-03 NOTE — Progress Notes (Signed)
Phone [phone]   Subjective:   Patient is a 58 y.o. female presenting for annual physical.    Chief Complaint  Patient presents with   Annual Exam    CPE Not fasting   Annual-some exercise  Discussed the use of AI scribe software for clinical note transcription with the patient, who gave verbal consent to proceed.  History of Present Illness   Sherri Wolf is a 58 year old female who presents for an annual physical exam.  She experiences urinary incontinence, particularly when sneezing, coughing, or when unable to reach a bathroom in time. This issue began after a procedure in 2019 involving an injection near the bladder during hepatic semi and ovary removal. She has attempted Kegel exercises but has not been consistent. She also reports frequent urination, approximately 5-6 times at night and 20 times during the day, which started around October 2024. No burning sensation during urination is noted.  She experiences blurry vision, which she describes as 'blurry' rather than a film over her eyes. She denies a history of diabetes.  She has a history of sleep apnea diagnosed in 2016 or 2017 in [place], for which she was given a CPAP machine. She is not currently using the CPAP as she was advised to change the equipment. She reports increased snoring and episodes where she stops breathing during sleep, as noted by her family. She feels tired during the day.  She reports neck pain that started about a week ago, limiting her ability to move her neck. She had a similar issue in 2013, which was treated with chiropractic care.        See problem oriented charting- ROS- ROS: Gen: no fever, chills  Skin: no rash, itching ENT: no ear pain, ear drainage, nasal congestion, rhinorrhea, sinus pressure, sore throat Eyes: no blurry vision, double vision Resp: no cough, wheeze,SOB.  Snoring more and apnea-dx in 2017-had cpap-saw doc at [facility] and  mix up-2020 or 2021-so ins changed and no f/u CV:  no CP, palpitations, LE edema,  GI: no heartburn, n/v/d/c, abd pain GU: no dysuria, urgency, frequency, hematuria MSK: neck worse.  Hard to turn neck.  Past 1 wk bad.  Neuro: no dizziness, headache, weakness, vertigo Psych: no depression, anxiety, insomnia, SI   The following were reviewed and entered/updated in epic: Past Medical History:  Diagnosis Date   Acute non-recurrent maxillary sinusitis 09/08/2020   Bilateral carpal tunnel syndrome 10/28/2019   Colon polyp 10/27/2017   COVID-19 ruled out by laboratory testing 09/08/2020   Depression, recurrent (HCC) 10/21/2022   Edema leg 12/04/2021   Essential hypertension 05/28/2017   Formatting of this note is different from the original.  HTN, Started in June 2018  Had dry cough with Lisinopril.  Changed to Toprol XL 25 mg August 2018     Last Assessment & Plan:   Formatting of this note might be different from the original.  Assessment:   Adequately controlled on medical therapy.   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       Excessive weight gain 06/06/2015   Last Assessment & Plan: Formatting of this note might be different from the original. A/P Cause of recent sudden weight gain is difficult to pin point. All lab w/u is unremarkable for systemic disease like HTN, DM, Hypothyroidism, renal disease (Nephrotic syndrome). Lupus and other connective tissue disease should be considered. Adv To have eval with internist and Rheumatologist.  Fibromyalgia 12/04/2021   Gastroesophageal reflux disease without esophagitis 12/04/2021   Hypercholesteremia 06/06/2015   Formatting of this note might be different from the original. Last Assessment & Plan: Assessment:  The condition is stable  No recent labs  Body mass index is 32.06 kg/m. Plan:  Continue dietary measures  Aim is to achieve LDL level  below 70  Continue regular exercise  Continue current medical management.  Check labs prior to next  visit has a current medication list which includes the fo   Hyperlipidemia    Hypertension 2017   resolved   Left ovarian cyst 10/27/2017   Low back pain 06/06/2015   Formatting of this note might be different from the original. Last Assessment & Plan: S: Still has right sided low back pain shooting down the leg. Also has rt shoulder pain which limits her arm movements. A/P Rec: Ortho eval, to r/o spinal stenosis. Last Assessment & Plan: Formatting of this note might be different from the original. S: Still has right sided low back pain shooting down the leg. A   Lower respiratory infection 09/08/2020   Migraine 10/27/2017   Formatting of this note might be different from the original. hx migraines, mri-mild periventricular white matter ischemic changes- 2018 June Formatting of this note might be different from the original. hx migraines, mri-mild periventricular white matter ischemic changes- 2018 June   Neck pain 10/28/2019   Numbness 10/28/2019   Obstructive sleep apnea (adult) (pediatric) 12/04/2021   Other chest pain 06/06/2015   Formatting of this note might be different from the original. 06/06/2015 EST echo 4 mins standard Bruce protocol. No CP Last Assessment & Plan: Formatting of this note might be different from the original. S: No cp. Still has shortness of breath while walking. A/P ReC: Pul evaluation, PFT,  to r/o asthma/ pul fibrosis.Echo is normal with no pulmonary HTN   Other specified postprocedural states 10/27/2017   Formatting of this note might be different from the original. hx of congential vaginal atresia   Pain in right knee 04/30/2018   Peripheral edema 01/07/2021   Persistent cough 03/02/2020   Prediabetes 07/01/2019   Primary osteoarthritis of both first carpometacarpal joints 10/28/2019   Pruritus 12/04/2021   Right ovarian cyst 10/27/2017   Trigger middle finger of left hand 10/28/2019   Vitamin D deficiency 12/30/2019   Patient Active Problem List    Diagnosis Date Noted   Familial hyperlipidemia 11/18/2022   Chest pain of uncertain etiology 11/18/2022   Depression, recurrent (HCC) 10/21/2022   Hyperlipidemia 12/04/2021   Obstructive sleep apnea (adult) (pediatric) 12/04/2021   Edema leg 12/04/2021   Fibromyalgia 12/04/2021   Gastroesophageal reflux disease without esophagitis 12/04/2021   Pruritus 12/04/2021   Peripheral edema 01/07/2021   Acute non-recurrent maxillary sinusitis 09/08/2020   COVID-19 ruled out by laboratory testing 09/08/2020   Lower respiratory infection 09/08/2020   Persistent cough 03/02/2020   Vitamin D deficiency 12/30/2019   Bilateral carpal tunnel syndrome 10/28/2019   Numbness 10/28/2019   Primary osteoarthritis of both first carpometacarpal joints 10/28/2019   Trigger middle finger of left hand 10/28/2019   Neck pain 10/28/2019   Prediabetes 07/01/2019   Pain in right knee 04/30/2018   Colon polyp 10/27/2017   Migraine 10/27/2017   Other specified postprocedural states 10/27/2017   Right ovarian cyst 10/27/2017   Left ovarian cyst 10/27/2017   Essential hypertension 05/28/2017   Hypertension 2017   Excessive weight gain 06/06/2015   Low back pain 06/06/2015  Other chest pain 06/06/2015   Hypercholesteremia 06/06/2015   Past Surgical History:  Procedure Laterality Date   TOTAL VAGINAL HYSTERECTOMY  2019    Family History  Problem Relation Age of Onset   Hypertension Mother    Hyperlipidemia Father    Hypertension Father    Lung disease Father     Medications- reviewed and updated Current Outpatient Medications  Medication Sig Dispense Refill   azelastine (ASTELIN) 0.1 % nasal spray Place 2 sprays into both nostrils 2 (two) times daily. 30 mL 12   Bempedoic Acid-Ezetimibe (NEXLIZET) 180-10 MG TABS Take 1 tablet by mouth daily in the afternoon. 90 tablet 3   cyanocobalamin (VITAMIN B12) 1000 MCG tablet Take 1 tablet by mouth daily.     hydrOXYzine (ATARAX) 25 MG tablet TAKE 1 TABLET  (25 MG TOTAL) BY MOUTH AS NEEDED. 90 tablet 1   methocarbamol (ROBAXIN) 500 MG tablet Take 1 tablet (500 mg total) by mouth every 8 (eight) hours as needed for muscle spasms. 30 tablet 3   Multiple Vitamins-Minerals (ONCOVITE) TABS Take 1 capsule by mouth daily.     pantoprazole (PROTONIX) 20 MG tablet TAKE 1 TABLET (20 MG TOTAL) BY MOUTH 2 (TWO) TIMES DAILY BEFORE A MEAL. TAKE 1 PILL TWICE A DAY FOR 2 WEEKS, THEN 1 PILL EVERY MORNING. (Patient taking differently: Take 20 mg by mouth as needed. Take 1 pill twice a day for 2 weeks, then 1 pill every morning.) 180 tablet 0   rosuvastatin (CRESTOR) 20 MG tablet Take 1 tablet (20 mg total) by mouth daily. 90 tablet 3   Vitamin D, Ergocalciferol, (DRISDOL) 1.25 MG (50000 UNIT) CAPS capsule TAKE 1 CAPSULE (50,000 UNITS TOTAL) BY MOUTH EVERY 7 (SEVEN) DAYS 12 capsule 0   No current facility-administered medications for this visit.    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    Step son and dau.  Grands-2    Objective  Objective:  BP 123/82   Pulse 87   Temp (!) 97.5 F (36.4 C) (Temporal)   Resp 16   Ht 5\' 4"  (1.626 m)   Wt 182 lb 6 oz (82.7 kg)   SpO2 100%   BMI 31.30 kg/m  Physical Exam  Gen: WDWN NAD HEENT: NCAT, conjunctiva not injected, sclera nonicteric TM WNL B, OP moist, no exudates  NECK:  supple, no thyromegaly, no nodes, no carotid bruits CARDIAC: RRR, S1S2+, no murmur. DP 2+B LUNGS: CTAB. No wheezes ABDOMEN:  BS+, soft, NTND, No HSM, no masses EXT:  no edema MSK: no gross abnormalities. MS 5/5 all 4 NEURO: A&O x3.  CN II-XII intact.  PSYCH: normal mood. Good eye contact   Results for orders placed or performed in visit on 11/03/23  POCT Urinalysis Dipstick (Automated)   Collection Time: 11/03/23  2:24 PM  Result Value Ref Range   Color, UA YELLOW    Clarity, UA CLEAR     Glucose, UA Negative Negative   Bilirubin, UA NEG    Ketones, UA NEG    Spec Grav, UA 1.025 1.010 - 1.025   Blood, UA NEG    pH, UA 5.5 5.0 - 8.0   Protein, UA Negative Negative   Urobilinogen, UA 0.2 0.2 or 1.0 E.U./dL   Nitrite, UA NEG    Leukocytes, UA Negative Negative      Assessment and Plan  Health Maintenance counseling: 1. Anticipatory guidance: Patient counseled regarding regular dental exams q6 months, eye exams,  avoiding smoking and second hand smoke, limiting alcohol to 1 beverage per day, no illicit drugs.   2. Risk factor reduction:  Advised patient of need for regular exercise and diet rich and fruits and vegetables to reduce risk of heart attack and stroke. Exercise- +.  Wt Readings from Last 3 Encounters:  11/03/23 182 lb 6 oz (82.7 kg)  02/20/23 173 lb (78.5 kg)  11/18/22 168 lb 4 oz (76.3 kg)   3. Immunizations/screenings/ancillary studies Immunization History  Administered Date(s) Administered   Hep A, Unspecified 08/02/2018, 03/16/2019   Hepatitis A 08/02/2018, 03/16/2019   Hepatitis A, Adult 08/02/2018, 03/16/2019   Influenza,inj,Quad PF,6+ Mos 07/23/2018, 07/01/2019   Influenza-Unspecified 05/27/2017, 07/23/2018, 08/08/2020, 06/08/2021   MMR 08/02/2018   Moderna Sars-Covid-2 Vaccination 11/26/2019, 12/27/2019   PFIZER(Purple Top)SARS-COV-2 Vaccination 08/08/2020   Pfizer Covid-19 Vaccine Bivalent Booster 46yrs & up 06/08/2021   Tdap 03/06/2017   Typhoid Live 07/21/2018   Health Maintenance Due  Topic Date Due   HIV Screening  Never done   Hepatitis C Screening  Never done    4. Cervical cancer screening- hyst 5. Breast cancer screening-  mammogram pt to sch 6. Colon cancer screening - utd 7. Skin cancer screening- advised regular sunscreen use. Denies worrisome, changing, or new skin lesions.  8. Birth control/STD check- n/a 9. Osteoporosis screening- n/a 10. Smoking associated screening - non smoker  Wellness examination -     CBC with  Differential/Platelet; Future -     Comprehensive metabolic panel; Future -     Lipid panel; Future -     TSH; Future -     Hemoglobin A1c; Future  Nocturia -     POCT Urinalysis Dipstick (Automated)  Obstructive sleep apnea (adult) (pediatric) -     Ambulatory referral to Neurology  Cervicalgia -     Ambulatory referral to Physical Therapy  Screening for viral disease -     Hepatitis C antibody; Future   Wellness-anticipatory guidance.  Work on Diet/Exercise  Check CBC,CMP,lipids,TSH, A1C.  F/u 1 yr   Assessment and Plan    Urinary Incontinence Intermittent urinary leakage occurs primarily with sneezing or coughing, along with occasional dribbling. Symptoms have recurred since prior injection treatment in 2019. Kegel exercises and frequent voiding were discussed, with a preference for exercises before considering a urology referral. Recommend Kegel exercises and frequent voiding. Consider urology referral if symptoms persist.  Nocturia and Frequent Daytime Urination Urinates 5-6 times per night and 20 times during the day without dysuria or straining. Differential includes overactive bladder and potential diabetes. Medication for overactive bladder was discussed, noting the risk of constipation. Agreed to monitor symptoms and reassess after March. Perform urinalysis and consider medication for overactive bladder if symptoms persist. Monitor for potential diabetes with blood work.  Blurry Vision Experiencing blurry vision without a history of diabetes. Needs evaluation to rule out other causes, including diabetes. Recommend ophthalmology consultation and include blood work to rule out diabetes.  Sleep Apnea Diagnosed sleep apnea with discontinued CPAP use due to equipment issues. Increased snoring and apnea noted by family. A new sleep study and potential benefits of weight loss were discussed. Refer for a new sleep study and advise weight loss.  Neck Pain Chronic neck pain with  recent exacerbation limits range of motion. Previously treated with chiropractic care and open to physical therapy. Refer to physical therapy at [facility].  General Health Maintenance Routine annual physical shows no new surgeries or significant family history changes. No smoking, drinking, or drug use. Regular exercise, a healthy diet, and routine screenings were discussed. Encourage regular exercise and a healthy diet. Advise wearing a seatbelt and avoiding drinking and driving. Schedule blood work and urine test for tomorrow.  Follow-up Schedule a follow-up visit in two weeks for medication renewal. Perform blood work and urine test tomorrow. Schedule mammogram independently.         Recommended follow up: Return for labs soon.  2 wks gen f/u.  Lab/Order associations:return fasting  Sherri Sole, MD

## 2023-11-04 ENCOUNTER — Other Ambulatory Visit: Payer: Medicaid Other

## 2023-11-04 DIAGNOSIS — Z13220 Encounter for screening for lipoid disorders: Secondary | ICD-10-CM

## 2023-11-04 DIAGNOSIS — Z0000 Encounter for general adult medical examination without abnormal findings: Secondary | ICD-10-CM | POA: Diagnosis not present

## 2023-11-04 DIAGNOSIS — Z131 Encounter for screening for diabetes mellitus: Secondary | ICD-10-CM | POA: Diagnosis not present

## 2023-11-04 DIAGNOSIS — Z1159 Encounter for screening for other viral diseases: Secondary | ICD-10-CM | POA: Diagnosis not present

## 2023-11-04 LAB — CBC WITH DIFFERENTIAL/PLATELET
Basophils Absolute: 0 10*3/uL (ref 0.0–0.1)
Basophils Relative: 0.8 % (ref 0.0–3.0)
Eosinophils Absolute: 0.2 10*3/uL (ref 0.0–0.7)
Eosinophils Relative: 3.6 % (ref 0.0–5.0)
HCT: 38.4 % (ref 36.0–46.0)
Hemoglobin: 12.8 g/dL (ref 12.0–15.0)
Lymphocytes Relative: 30.5 % (ref 12.0–46.0)
Lymphs Abs: 1.8 10*3/uL (ref 0.7–4.0)
MCHC: 33.3 g/dL (ref 30.0–36.0)
MCV: 81.9 fL (ref 78.0–100.0)
Monocytes Absolute: 0.4 10*3/uL (ref 0.1–1.0)
Monocytes Relative: 6.7 % (ref 3.0–12.0)
Neutro Abs: 3.4 10*3/uL (ref 1.4–7.7)
Neutrophils Relative %: 58.4 % (ref 43.0–77.0)
Platelets: 338 10*3/uL (ref 150.0–400.0)
RBC: 4.68 Mil/uL (ref 3.87–5.11)
RDW: 13.5 % (ref 11.5–15.5)
WBC: 5.9 10*3/uL (ref 4.0–10.5)

## 2023-11-04 LAB — COMPREHENSIVE METABOLIC PANEL
ALT: 17 U/L (ref 0–35)
AST: 21 U/L (ref 0–37)
Albumin: 4.2 g/dL (ref 3.5–5.2)
Alkaline Phosphatase: 99 U/L (ref 39–117)
BUN: 11 mg/dL (ref 6–23)
CO2: 28 meq/L (ref 19–32)
Calcium: 9.3 mg/dL (ref 8.4–10.5)
Chloride: 102 meq/L (ref 96–112)
Creatinine, Ser: 0.75 mg/dL (ref 0.40–1.20)
GFR: 88.72 mL/min (ref >60.00)
Glucose, Bld: 104 mg/dL — ABNORMAL HIGH (ref 70–99)
Potassium: 4.2 meq/L (ref 3.5–5.1)
Sodium: 139 meq/L (ref 135–145)
Total Bilirubin: 0.6 mg/dL (ref 0.2–1.2)
Total Protein: 7.6 g/dL (ref 6.0–8.3)

## 2023-11-04 LAB — LIPID PANEL
Cholesterol: 334 mg/dL — ABNORMAL HIGH (ref 0–200)
HDL: 55.4 mg/dL (ref >39.00)
LDL Cholesterol: 227 mg/dL — ABNORMAL HIGH (ref 0–99)
NonHDL: 278.27
Total CHOL/HDL Ratio: 6
Triglycerides: 254 mg/dL — ABNORMAL HIGH (ref 0.0–149.0)
VLDL: 50.8 mg/dL — ABNORMAL HIGH (ref 0.0–40.0)

## 2023-11-04 LAB — HEMOGLOBIN A1C: Hgb A1c MFr Bld: 6.3 % (ref 4.6–6.5)

## 2023-11-04 LAB — TSH: TSH: 2.18 u[IU]/mL (ref 0.35–5.50)

## 2023-11-05 ENCOUNTER — Encounter: Payer: Self-pay | Admitting: Family Medicine

## 2023-11-05 LAB — HEPATITIS C ANTIBODY: Hepatitis C Ab: NONREACTIVE

## 2023-11-05 NOTE — Progress Notes (Signed)
Labs good except 1.  A1C(3 month average of sugars) is elevated.  This is considered PreDiabetes.  Work on diet-decrease sugars and starches and aim for 30 minutes of exercise 5 days/week to prevent progression to diabetes  2.  Cholesterol way out of control.  Had she run out of or missed doses of meds? We will discuss more at appt in 2 weeks

## 2023-11-11 ENCOUNTER — Ambulatory Visit: Payer: Medicaid Other

## 2023-11-16 ENCOUNTER — Ambulatory Visit (INDEPENDENT_AMBULATORY_CARE_PROVIDER_SITE_OTHER): Payer: Medicaid Other | Admitting: Family Medicine

## 2023-11-16 ENCOUNTER — Encounter: Payer: Self-pay | Admitting: Family Medicine

## 2023-11-16 VITALS — BP 137/91 | HR 84 | Temp 97.8°F | Resp 16 | Ht 64.0 in | Wt 183.5 lb

## 2023-11-16 DIAGNOSIS — E7849 Other hyperlipidemia: Secondary | ICD-10-CM

## 2023-11-16 DIAGNOSIS — L299 Pruritus, unspecified: Secondary | ICD-10-CM

## 2023-11-16 DIAGNOSIS — J301 Allergic rhinitis due to pollen: Secondary | ICD-10-CM | POA: Insufficient documentation

## 2023-11-16 DIAGNOSIS — M791 Myalgia, unspecified site: Secondary | ICD-10-CM

## 2023-11-16 DIAGNOSIS — M5481 Occipital neuralgia: Secondary | ICD-10-CM

## 2023-11-16 DIAGNOSIS — R7303 Prediabetes: Secondary | ICD-10-CM | POA: Diagnosis not present

## 2023-11-16 DIAGNOSIS — R1084 Generalized abdominal pain: Secondary | ICD-10-CM | POA: Diagnosis not present

## 2023-11-16 LAB — C-REACTIVE PROTEIN: CRP: <1 mg/dL (ref 0.5–20.0)

## 2023-11-16 LAB — SEDIMENTATION RATE: Sed Rate: 66 mm/h — ABNORMAL HIGH (ref 0–30)

## 2023-11-16 MED ORDER — AZELASTINE HCL 0.1 % NA SOLN: 2.0000 | Freq: Two times a day (BID) | NASAL | 12 refills | Status: DC

## 2023-11-16 MED ORDER — ROSUVASTATIN CALCIUM 20 MG PO TABS: 20.0000 mg | ORAL_TABLET | Freq: Every day | ORAL | 1 refills | Status: DC

## 2023-11-16 MED ORDER — HYDROXYZINE HCL 25 MG PO TABS: 25.0000 mg | ORAL_TABLET | ORAL | 1 refills | Status: DC | PRN

## 2023-11-16 NOTE — Progress Notes (Signed)
 Crp normal but sed rate is elevated.  Not sure why.  Does she want to try prednisone 40mg  daily for 5days?  May want to f/u sports med.

## 2023-11-16 NOTE — Patient Instructions (Signed)
 Physical therapy  Occipital neuralgia

## 2023-11-16 NOTE — Progress Notes (Signed)
 Subjective:     Patient ID: Sherri Wolf, female    DOB: April 14, 1966, 58 y.o.   MRN: [id number]  Chief Complaint  Patient presents with   Follow-up    2 week follow-up  Scalp soreness in top, noticed 5 or 6 days ago Pain on left side of naval off and on for about 1 week    HPI HLD  Discussed the use of AI scribe software for clinical note transcription with the patient, who gave verbal consent to proceed.  History of Present Illness   Sherri Wolf is a 58 year old female who presents with scalp pain and abdominal pain.  She has been experiencing scalp pain for the past week, which is exacerbated by tying her hair in a ponytail or combing it. The pain is described as a pulling sensation and persists even when the ponytail is tied loosely. It is significant enough to prevent her from sleeping with her hair tied. No lumps or scabs are present on the scalp.  She reports intermittent left abdominal pain that started a few days ago. The pain occurs on and off, lasting for one to two hours before subsiding. It is not associated with nausea, vomiting, or changes in bowel movements, which remain regular. The pain is significant enough to stop her from walking or sitting comfortably. She had a normal colonoscopy in 2021 and a history of hysterectomy with removal of ovaries.  had different abd pain in 2023-CT neg  She is currently taking Nexlizet and rosuvastatin for cholesterol management, with a recent gap in medication use during a trip to [place], which led to elevated cholesterol levels. She resumed regular medication use upon returning. She uses hydroxyzine as needed for itching, Astelin nasal spray as needed for allergies, and pantoprazole as needed for reflux. She takes B12 and vitamin D supplements over the counter.  She has a history of prediabetes, which she is managing with diet and exercise.       Health Maintenance Due  Topic Date Due   HIV Screening  Never done    Past Medical  History:  Diagnosis Date   Acute non-recurrent maxillary sinusitis 09/08/2020   Bilateral carpal tunnel syndrome 10/28/2019   Colon polyp 10/27/2017   COVID-19 ruled out by laboratory testing 09/08/2020   Depression, recurrent (HCC) 10/21/2022   Edema leg 12/04/2021   Essential hypertension 05/28/2017   Formatting of this note is different from the original.  HTN, Started in June 2018  Had dry cough with Lisinopril.  Changed to Toprol XL 25 mg August 2018     Last Assessment & Plan:   Formatting of this note might be different from the original.  Assessment:   Adequately controlled on medical therapy.   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       Excessive weight gain 06/06/2015   Last Assessment & Plan: Formatting of this note might be different from the original. A/P Cause of recent sudden weight gain is difficult to pin point. All lab w/u is unremarkable for systemic disease like HTN, DM, Hypothyroidism, renal disease (Nephrotic syndrome). Lupus and other connective tissue disease should be considered. Adv To have eval with internist and Rheumatologist.   Fibromyalgia 12/04/2021   Gastroesophageal reflux disease without esophagitis 12/04/2021   Hypercholesteremia 06/06/2015   Formatting of this note might be different from the original. Last Assessment & Plan: Assessment:  The condition is stable  No recent  labs  Body mass index is 32.06 kg/m. Plan:  Continue dietary measures  Aim is to achieve LDL level  below 70  Continue regular exercise  Continue current medical management.  Check labs prior to next visit has a current medication list which includes the fo   Hyperlipidemia    Hypertension 2017   resolved   Left ovarian cyst 10/27/2017   Low back pain 06/06/2015   Formatting of this note might be different from the original. Last Assessment & Plan: S: Still has right sided low back pain shooting down the leg. Also has rt  shoulder pain which limits her arm movements. A/P Rec: Ortho eval, to r/o spinal stenosis. Last Assessment & Plan: Formatting of this note might be different from the original. S: Still has right sided low back pain shooting down the leg. A   Lower respiratory infection 09/08/2020   Migraine 10/27/2017   Formatting of this note might be different from the original. hx migraines, mri-mild periventricular white matter ischemic changes- 2018 June Formatting of this note might be different from the original. hx migraines, mri-mild periventricular white matter ischemic changes- 2018 June   Neck pain 10/28/2019   Numbness 10/28/2019   Obstructive sleep apnea (adult) (pediatric) 12/04/2021   Other chest pain 06/06/2015   Formatting of this note might be different from the original. 06/06/2015 EST echo 4 mins standard Bruce protocol. No CP Last Assessment & Plan: Formatting of this note might be different from the original. S: No cp. Still has shortness of breath while walking. A/P ReC: Pul evaluation, PFT,  to r/o asthma/ pul fibrosis.Echo is normal with no pulmonary HTN   Other specified postprocedural states 10/27/2017   Formatting of this note might be different from the original. hx of congential vaginal atresia   Pain in right knee 04/30/2018   Peripheral edema 01/07/2021   Persistent cough 03/02/2020   Prediabetes 07/01/2019   Primary osteoarthritis of both first carpometacarpal joints 10/28/2019   Pruritus 12/04/2021   Right ovarian cyst 10/27/2017   Trigger middle finger of left hand 10/28/2019   Vitamin D deficiency 12/30/2019    Past Surgical History:  Procedure Laterality Date   TOTAL VAGINAL HYSTERECTOMY  2019     Current Outpatient Medications:    Bempedoic Acid-Ezetimibe (NEXLIZET) 180-10 MG TABS, Take 1 tablet by mouth daily in the afternoon., Disp: 90 tablet, Rfl: 3   cyanocobalamin (VITAMIN B12) 1000 MCG tablet, Take 1 tablet by mouth daily., Disp: , Rfl:    Multiple  Vitamins-Minerals (ONCOVITE) TABS, Take 1 capsule by mouth daily., Disp: , Rfl:    pantoprazole (PROTONIX) 20 MG tablet, TAKE 1 TABLET (20 MG TOTAL) BY MOUTH 2 (TWO) TIMES DAILY BEFORE A MEAL. TAKE 1 PILL TWICE A DAY FOR 2 WEEKS, THEN 1 PILL EVERY MORNING. (Patient taking differently: Take 20 mg by mouth as needed. Take 1 pill twice a day for 2 weeks, then 1 pill every morning.), Disp: 180 tablet, Rfl: 0   Vitamin D, Ergocalciferol, (DRISDOL) 1.25 MG (50000 UNIT) CAPS capsule, TAKE 1 CAPSULE (50,000 UNITS TOTAL) BY MOUTH EVERY 7 (SEVEN) DAYS, Disp: 12 capsule, Rfl: 0   azelastine (ASTELIN) 0.1 % nasal spray, Place 2 sprays into both nostrils 2 (two) times daily., Disp: 30 mL, Rfl: 12   hydrOXYzine (ATARAX) 25 MG tablet, Take 1 tablet (25 mg total) by mouth as needed., Disp: 90 tablet, Rfl: 1   rosuvastatin (CRESTOR) 20 MG tablet, Take 1 tablet (20 mg total) by mouth daily.,  Disp: 90 tablet, Rfl: 1  No Known Allergies ROS neg/noncontributory except as noted HPI/below      Objective:     BP (!) 137/91   Pulse 84   Temp 97.8 F (36.6 C) (Temporal)   Resp 16   Ht 5\' 4"  (1.626 m)   Wt 183 lb 8 oz (83.2 kg)   SpO2 100%   BMI 31.50 kg/m  Wt Readings from Last 3 Encounters:  11/16/23 183 lb 8 oz (83.2 kg)  11/03/23 182 lb 6 oz (82.7 kg)  02/20/23 173 lb (78.5 kg)    Physical Exam   Gen: WDWN NAD HEENT: NCAT, conjunctiva not injected, sclera nonicteric ABDOMEN:  BS+, soft, mod tender lower abd, No HSM, no masses EXT:  no edema MSK: no gross abnormalities.  NEURO: A&O x3.  CN II-XII intact.  PSYCH: normal mood. Good eye contact  Scalp-no lesions.  Some TTP from R occip area to front  Reviewed labs-pt had been off meds     Assessment & Plan:  Familial hyperlipidemia  Prediabetes  Pruritus  Non-seasonal allergic rhinitis due to pollen  Myalgia -     Sedimentation rate -     C-reactive protein  Generalized abdominal pain -     CT ABDOMEN PELVIS W CONTRAST;  Future  Occipital neuralgia of right side  Other orders -     Azelastine HCl; Place 2 sprays into both nostrils 2 (two) times daily.  Dispense: 30 mL; Refill: 12 -     Rosuvastatin Calcium; Take 1 tablet (20 mg total) by mouth daily.  Dispense: 90 tablet; Refill: 1 -     hydrOXYzine HCl; Take 1 tablet (25 mg total) by mouth as needed.  Dispense: 90 tablet; Refill: 1    Return in about 6 months (around 05/15/2024) for chronic follow-up.  Angelena Sole, MD

## 2023-11-18 ENCOUNTER — Other Ambulatory Visit: Payer: Self-pay

## 2023-11-18 ENCOUNTER — Ambulatory Visit: Payer: Medicaid Other | Attending: Family Medicine | Admitting: Physical Therapy

## 2023-11-18 ENCOUNTER — Encounter: Payer: Self-pay | Admitting: Physical Therapy

## 2023-11-18 DIAGNOSIS — M6281 Muscle weakness (generalized): Secondary | ICD-10-CM | POA: Insufficient documentation

## 2023-11-18 DIAGNOSIS — M542 Cervicalgia: Secondary | ICD-10-CM | POA: Diagnosis present

## 2023-11-18 DIAGNOSIS — M25611 Stiffness of right shoulder, not elsewhere classified: Secondary | ICD-10-CM | POA: Diagnosis present

## 2023-11-18 DIAGNOSIS — R252 Cramp and spasm: Secondary | ICD-10-CM | POA: Diagnosis present

## 2023-11-18 NOTE — Patient Instructions (Signed)
"  TAKE YOUR MEDS"   Acronym to reduce the body's inflammatory response  1) MEDITATION: Mindfulness and meditation have been proven to reduce the brain's over-sensitization and stimulation   2) EXERCISE: Activity pumps the muscles which in return washes those inflammatory cells away   3)DIET: Eat healthy foods especially plants. Stay away from processed foods, especially sugar which has been proven to INCREASE pain levels!   4)SLEEP: Prioritize sleep right now.  Avoid screens for the 30 minutes before bedtime.  A cool, dark room is best for sleeping.  Your body needs quality sleep for healing.     Shoulder blade squeezes, bicep curls, walking

## 2023-11-18 NOTE — Therapy (Signed)
 OUTPATIENT PHYSICAL THERAPY CERVICAL EVALUATION   Patient Name: Sherri Wolf MRN: [id number] DOB:[birth year], 58 y.o., female Today's Date: 11/18/2023  END OF SESSION:  PT End of Session - 11/18/23 0931     Visit Number 1    Date for PT Re-Evaluation 01/13/24    Authorization Type [employer] no auth required    PT Start Time 0933    PT Stop Time 1015    PT Time Calculation (min) 42 min    Activity Tolerance Patient tolerated treatment well             Past Medical History:  Diagnosis Date   Acute non-recurrent maxillary sinusitis 09/08/2020   Bilateral carpal tunnel syndrome 10/28/2019   Colon polyp 10/27/2017   COVID-19 ruled out by laboratory testing 09/08/2020   Depression, recurrent (HCC) 10/21/2022   Edema leg 12/04/2021   Essential hypertension 05/28/2017   Formatting of this note is different from the original.  HTN, Started in June 2018  Had dry cough with Lisinopril.  Changed to Toprol XL 25 mg August 2018     Last Assessment & Plan:   Formatting of this note might be different from the original.  Assessment:   Adequately controlled on medical therapy.   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       Excessive weight gain 06/06/2015   Last Assessment & Plan: Formatting of this note might be different from the original. A/P Cause of recent sudden weight gain is difficult to pin point. All lab w/u is unremarkable for systemic disease like HTN, DM, Hypothyroidism, renal disease (Nephrotic syndrome). Lupus and other connective tissue disease should be considered. Adv To have eval with internist and Rheumatologist.   Fibromyalgia 12/04/2021   Gastroesophageal reflux disease without esophagitis 12/04/2021   Hypercholesteremia 06/06/2015   Formatting of this note might be different from the original. Last Assessment & Plan: Assessment:  The condition is stable  No recent labs  Body mass index is 32.06 kg/m. Plan:  Continue  dietary measures  Aim is to achieve LDL level  below 70  Continue regular exercise  Continue current medical management.  Check labs prior to next visit has a current medication list which includes the fo   Hyperlipidemia    Hypertension 2017   resolved   Left ovarian cyst 10/27/2017   Low back pain 06/06/2015   Formatting of this note might be different from the original. Last Assessment & Plan: S: Still has right sided low back pain shooting down the leg. Also has rt shoulder pain which limits her arm movements. A/P Rec: Ortho eval, to r/o spinal stenosis. Last Assessment & Plan: Formatting of this note might be different from the original. S: Still has right sided low back pain shooting down the leg. A   Lower respiratory infection 09/08/2020   Migraine 10/27/2017   Formatting of this note might be different from the original. hx migraines, mri-mild periventricular white matter ischemic changes- 2018 June Formatting of this note might be different from the original. hx migraines, mri-mild periventricular white matter ischemic changes- 2018 June   Neck pain 10/28/2019   Numbness 10/28/2019   Obstructive sleep apnea (adult) (pediatric) 12/04/2021   Other chest pain 06/06/2015   Formatting of this note might be different from the original. 06/06/2015 EST echo 4 mins standard Bruce protocol. No CP Last Assessment & Plan: Formatting of this note might be different from the original. S:  No cp. Still has shortness of breath while walking. A/P ReC: Pul evaluation, PFT,  to r/o asthma/ pul fibrosis.Echo is normal with no pulmonary HTN   Other specified postprocedural states 10/27/2017   Formatting of this note might be different from the original. hx of congential vaginal atresia   Pain in right knee 04/30/2018   Peripheral edema 01/07/2021   Persistent cough 03/02/2020   Prediabetes 07/01/2019   Primary osteoarthritis of both first carpometacarpal joints 10/28/2019   Pruritus 12/04/2021   Right  ovarian cyst 10/27/2017   Trigger middle finger of left hand 10/28/2019   Vitamin D deficiency 12/30/2019   Past Surgical History:  Procedure Laterality Date   TOTAL VAGINAL HYSTERECTOMY  2019   Patient Active Problem List   Diagnosis Date Noted   Non-seasonal allergic rhinitis due to pollen 11/16/2023   Familial hyperlipidemia 11/18/2022   Chest pain of uncertain etiology 11/18/2022   Depression, recurrent (HCC) 10/21/2022   Hyperlipidemia 12/04/2021   Obstructive sleep apnea (adult) (pediatric) 12/04/2021   Edema leg 12/04/2021   Fibromyalgia 12/04/2021   Gastroesophageal reflux disease without esophagitis 12/04/2021   Pruritus 12/04/2021   Peripheral edema 01/07/2021   Acute non-recurrent maxillary sinusitis 09/08/2020   COVID-19 ruled out by laboratory testing 09/08/2020   Lower respiratory infection 09/08/2020   Persistent cough 03/02/2020   Vitamin D deficiency 12/30/2019   Bilateral carpal tunnel syndrome 10/28/2019   Numbness 10/28/2019   Primary osteoarthritis of both first carpometacarpal joints 10/28/2019   Trigger middle finger of left hand 10/28/2019   Neck pain 10/28/2019   Prediabetes 07/01/2019   Pain in right knee 04/30/2018   Colon polyp 10/27/2017   Migraine 10/27/2017   Other specified postprocedural states 10/27/2017   Right ovarian cyst 10/27/2017   Left ovarian cyst 10/27/2017   Essential hypertension 05/28/2017   Hypertension 2017   Excessive weight gain 06/06/2015   Low back pain 06/06/2015   Other chest pain 06/06/2015   Hypercholesteremia 06/06/2015    PCP: Jeani Sow MD  REFERRING PROVIDER: Jeani Sow MD  REFERRING DIAG: M54.2 cervicalgia  THERAPY DIAG:  Neck pain; weakness Rationale for Evaluation and Treatment: Rehabilitation  ONSET DATE: 1 week  SUBJECTIVE:                                                                                                                                                                                                          SUBJECTIVE STATEMENT: Has a history of right neck pain but over the last 6-7 days  onset of right shoulder pain  to upper arm and feeling it Into the side of her head too.  Hand dominance: Left  PERTINENT HISTORY:  Fibromyalgia; bil carpal tunnel syndrome with tingling ; history of low back pain; HTN; 2013 had same issue in neck and had chiro and got better; 2023 had symptoms for a few days ESR levels are high has not started on medication PAIN:   Are you having pain? Yes NPRS scale: 8-9/10 Pain location: right neck, right shoulder, right upper arm, right posterior/lateral head Pain orientation: Right  PAIN TYPE: burning Pain description: constant  Aggravating factors: turning head to the left; night time; lift heavy bag of vegetables or push something better;  looking down or up; cooking Relieving factors: nothing identified  PRECAUTIONS: None   WEIGHT BEARING RESTRICTIONS: No  FALLS:  Has patient fallen in last 6 months? 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  DIAGNOSTIC FINDINGS:  CERVICAL SPINE -2023  COMPARISON:  None. FINDINGS: Vertebral body height and alignment are maintained. Intervertebral disc space height is normal. Minimal anterior endplate spurring is noted. Mild facet degenerative change is seen at C7-T1. Prevertebral soft tissues appear normal. Lung apices are clear. IMPRESSION: Mild appearing cervical degenerative change.  Otherwise negative.  PATIENT SURVEYS:  NDI 62% moderate to severe self perceived disability  COGNITION: Overall cognitive status: Within functional limits for tasks assessed  POSTURE: rounded shoulders  PALPATION: Very tender right upper traps, levator scap, rhobmoids,  subocciptals Cervical joint hypomobility C4-7 PA and lateral glides painful    CERVICAL ROM:   Active ROM A/PROM (deg) eval  Flexion 25 pain  Extension 0 pain  Right lateral flexion 20 pain  Left lateral flexion 10 pain  Right rotation 15 pain  Left rotation 25 pain   (Blank rows = not tested)  UPPER EXTREMITY VHQ:IONGE shoulder flexion 150, right shoulder abduction 145 painful; right shoulder internal rotation to right lateral hip painful  UPPER EXTREMITY MMT: grossly 4-/5 right shoulder, scapula  CERVICAL SPECIAL TESTS:  + upper limb tension bilaterally: right > left No improvement in symptoms with cervical distraction   TREATMENT DATE: 11/18/23                                                                                                                              Given handout about DN, pt to consider for next time Patient instruction on good nutrition, light exercise, prioritization of sleep, meditation/mindfulness to promote healing and reduced inflammatory response   PATIENT EDUCATION:  Education details: Educated patient on anatomy and physiology of current symptoms, prognosis, plan of care as well as initial self care strategies to promote recovery Person educated: Patient Education method: Explanation Education comprehension: verbalized understanding  HOME EXERCISE PROGRAM: To be started  ASSESSMENT:  CLINICAL  IMPRESSION: Patient is a 58 y.o. female who was seen today for physical therapy evaluation and treatment for cervicalgia. The patient would benefit from skilled PT to address decreased cervical range of motion, decreased shoulder ROM, correct muscle strength asymmetries and weakness, improve postural strength including and scapular retractor and depressors and address pain levels.  All affect patient's ability to perform ADLS including reading, working, driving, sleeping and lifting/carrying items like groceries.    OBJECTIVE IMPAIRMENTS: decreased  activity tolerance, decreased ROM, decreased strength, hypomobility, increased fascial restrictions, impaired perceived functional ability, increased muscle spasms, impaired UE functional use, and pain.   ACTIVITY LIMITATIONS: carrying, lifting, sitting, sleeping, bathing, dressing, reach over head, hygiene/grooming, and caring for others  PARTICIPATION LIMITATIONS: meal prep, cleaning, laundry, driving, community activity, and occupation  PERSONAL FACTORS: Time since onset of injury/illness/exacerbation and 3+ comorbidities: prior history of neck and back pain, bil carpal tunnel syndrome with ongoing paresthesia and fibromyalgia  are also affecting patient's functional outcome.   REHAB POTENTIAL: Good  CLINICAL DECISION MAKING: Evolving/moderate complexity  EVALUATION COMPLEXITY: Moderate   GOALS: Goals reviewed with patient? Yes  SHORT TERM GOALS: Target date: 12/16/2023    The patient will demonstrate knowledge of basic self care strategies and exercises to promote healing  Baseline:  Goal status: INITIAL  2.  The patient will report a 30% improvement in pain levels with functional activities which are currently difficult including turning her head for driving, carrying grocery bags, sleeping Baseline:  Goal status: INITIAL  3.  The patient will have improved shoulder abduction/elevation ROM to at least 155 degrees needed for grooming/dressing purposes as well as reaching high shelves  Baseline:  Goal status: INITIAL  4.  Improved cervical rotation ROM to 35 degrees needed for driving Baseline:  Goal status: INITIAL    LONG TERM GOALS: Target date: 01/13/2024   The patient will be independent in a safe self progression of a home exercise program to promote further recovery of function  Baseline:  Goal status: INITIAL  2.  The patient will report a 60% improvement in pain levels with functional activities which are currently difficult including turning her head for  driving, carrying grocery bags, sleeping Baseline:  Goal status: INITIAL  3.  The patient will have grossly 4+/5 strength needed to carry grocery bags Baseline:  Goal status: INITIAL  4.  Right shoulder abduction 160 degrees and internal rotation to L1 needed for dressing tasks Baseline:  Goal status: INITIAL  5.  Neck Disability Index improved to   50  % indicating improved function with less pain Baseline:  Goal status: INITIAL    PLAN:  PT FREQUENCY: 2x/week  PT DURATION: 8 weeks  PLANNED INTERVENTIONS: [id number]- PT Re-evaluation, 97110-Therapeutic exercises, 97530- Therapeutic activity, 97112- Neuromuscular re-education, 97535- Self Care, [id number]- Manual therapy, [phone]- Aquatic Therapy, [id number]- Electrical stimulation (unattended), [phone]- Electrical stimulation (manual), [id number]- Ultrasound, [id number]- Traction (mechanical), [id number]- Ionotophoresis 4mg /ml Dexamethasone, Patient/Family education, Taping, Dry Needling, Joint mobilization, Spinal manipulation, Spinal mobilization, Cryotherapy, and Moist heat  PLAN FOR NEXT SESSION: high level of symptom irritability; pt considering DN (upper traps, cervical multifidi, suboccipitals); cervical and shoulder submax isometrics  Lavinia Sharps, PT 11/18/23 7:28 PM Phone: [phone] Fax: [phone]

## 2023-11-19 ENCOUNTER — Ambulatory Visit (HOSPITAL_BASED_OUTPATIENT_CLINIC_OR_DEPARTMENT_OTHER)
Admission: RE | Admit: 2023-11-19 | Discharge: 2023-11-19 | Disposition: A | Discharge status: Home / Self Care | Payer: Medicaid Other | Source: Ambulatory Visit | Attending: Family Medicine | Admitting: Family Medicine

## 2023-11-19 DIAGNOSIS — R1084 Generalized abdominal pain: Secondary | ICD-10-CM | POA: Diagnosis not present

## 2023-11-19 DIAGNOSIS — K5730 Diverticulosis of large intestine without perforation or abscess without bleeding: Secondary | ICD-10-CM | POA: Diagnosis not present

## 2023-11-19 DIAGNOSIS — Z90710 Acquired absence of both cervix and uterus: Secondary | ICD-10-CM | POA: Diagnosis not present

## 2023-11-19 MED ORDER — IOHEXOL 300 MG/ML  SOLN
100.0000 mL | Freq: Once | INTRAMUSCULAR | Status: AC | PRN
  Administered 2023-11-19: 100 mL via INTRAVENOUS

## 2023-11-19 NOTE — Therapy (Incomplete)
 OUTPATIENT PHYSICAL THERAPY CERVICAL TREATMENT   Patient Name: Sherri Wolf MRN: [id number] DOB:[birth year], 58 y.o., female Today's Date: 11/19/2023  END OF SESSION:    Past Medical History:  Diagnosis Date   Acute non-recurrent maxillary sinusitis 09/08/2020   Bilateral carpal tunnel syndrome 10/28/2019   Colon polyp 10/27/2017   COVID-19 ruled out by laboratory testing 09/08/2020   Depression, recurrent (HCC) 10/21/2022   Edema leg 12/04/2021   Essential hypertension 05/28/2017   Formatting of this note is different from the original.  HTN, Started in June 2018  Had dry cough with Lisinopril.  Changed to Toprol XL 25 mg August 2018     Last Assessment & Plan:   Formatting of this note might be different from the original.  Assessment:   Adequately controlled on medical therapy.   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       Excessive weight gain 06/06/2015   Last Assessment & Plan: Formatting of this note might be different from the original. A/P Cause of recent sudden weight gain is difficult to pin point. All lab w/u is unremarkable for systemic disease like HTN, DM, Hypothyroidism, renal disease (Nephrotic syndrome). Lupus and other connective tissue disease should be considered. Adv To have eval with internist and Rheumatologist.   Fibromyalgia 12/04/2021   Gastroesophageal reflux disease without esophagitis 12/04/2021   Hypercholesteremia 06/06/2015   Formatting of this note might be different from the original. Last Assessment & Plan: Assessment:  The condition is stable  No recent labs  Body mass index is 32.06 kg/m. Plan:  Continue dietary measures  Aim is to achieve LDL level  below 70  Continue regular exercise  Continue current medical management.  Check labs prior to next visit has a current medication list which includes the fo   Hyperlipidemia    Hypertension 2017   resolved   Left ovarian cyst 10/27/2017   Low  back pain 06/06/2015   Formatting of this note might be different from the original. Last Assessment & Plan: S: Still has right sided low back pain shooting down the leg. Also has rt shoulder pain which limits her arm movements. A/P Rec: Ortho eval, to r/o spinal stenosis. Last Assessment & Plan: Formatting of this note might be different from the original. S: Still has right sided low back pain shooting down the leg. A   Lower respiratory infection 09/08/2020   Migraine 10/27/2017   Formatting of this note might be different from the original. hx migraines, mri-mild periventricular white matter ischemic changes- 2018 June Formatting of this note might be different from the original. hx migraines, mri-mild periventricular white matter ischemic changes- 2018 June   Neck pain 10/28/2019   Numbness 10/28/2019   Obstructive sleep apnea (adult) (pediatric) 12/04/2021   Other chest pain 06/06/2015   Formatting of this note might be different from the original. 06/06/2015 EST echo 4 mins standard Bruce protocol. No CP Last Assessment & Plan: Formatting of this note might be different from the original. S: No cp. Still has shortness of breath while walking. A/P ReC: Pul evaluation, PFT,  to r/o asthma/ pul fibrosis.Echo is normal with no pulmonary HTN   Other specified postprocedural states 10/27/2017   Formatting of this note might be different from the original. hx of congential vaginal atresia   Pain in right knee 04/30/2018   Peripheral edema 01/07/2021   Persistent cough 03/02/2020   Prediabetes 07/01/2019   Primary  osteoarthritis of both first carpometacarpal joints 10/28/2019   Pruritus 12/04/2021   Right ovarian cyst 10/27/2017   Trigger middle finger of left hand 10/28/2019   Vitamin D deficiency 12/30/2019   Past Surgical History:  Procedure Laterality Date   TOTAL VAGINAL HYSTERECTOMY  2019   Patient Active Problem List   Diagnosis Date Noted   Non-seasonal allergic rhinitis due to  pollen 11/16/2023   Familial hyperlipidemia 11/18/2022   Chest pain of uncertain etiology 11/18/2022   Depression, recurrent (HCC) 10/21/2022   Hyperlipidemia 12/04/2021   Obstructive sleep apnea (adult) (pediatric) 12/04/2021   Edema leg 12/04/2021   Fibromyalgia 12/04/2021   Gastroesophageal reflux disease without esophagitis 12/04/2021   Pruritus 12/04/2021   Peripheral edema 01/07/2021   Acute non-recurrent maxillary sinusitis 09/08/2020   COVID-19 ruled out by laboratory testing 09/08/2020   Lower respiratory infection 09/08/2020   Persistent cough 03/02/2020   Vitamin D deficiency 12/30/2019   Bilateral carpal tunnel syndrome 10/28/2019   Numbness 10/28/2019   Primary osteoarthritis of both first carpometacarpal joints 10/28/2019   Trigger middle finger of left hand 10/28/2019   Neck pain 10/28/2019   Prediabetes 07/01/2019   Pain in right knee 04/30/2018   Colon polyp 10/27/2017   Migraine 10/27/2017   Other specified postprocedural states 10/27/2017   Right ovarian cyst 10/27/2017   Left ovarian cyst 10/27/2017   Essential hypertension 05/28/2017   Hypertension 2017   Excessive weight gain 06/06/2015   Low back pain 06/06/2015   Other chest pain 06/06/2015   Hypercholesteremia 06/06/2015    PCP: Jeani Sow MD  REFERRING PROVIDER: Jeani Sow MD  REFERRING DIAG: M54.2 cervicalgia  THERAPY DIAG:  Neck pain; weakness Rationale for Evaluation and Treatment: Rehabilitation  ONSET DATE: 1 week  SUBJECTIVE:                                                                                                                                                                                                         SUBJECTIVE STATEMENT: ***  Eval: Has a history of right neck pain but over the last 6-7 days  onset of right shoulder pain to upper arm and feeling it Into the side of her head too.  Hand dominance: Left  PERTINENT HISTORY:  Fibromyalgia; bil carpal  tunnel syndrome with tingling ; history of low back pain; HTN; 2013 had same issue in neck and had chiro and got better; 2023 had symptoms for a few days ESR levels are high has not started on medication PAIN:   Are you having pain? Yes  NPRS scale: 8-9/10 Pain location: right neck, right shoulder, right upper arm, right posterior/lateral head Pain orientation: Right  PAIN TYPE: burning Pain description: constant  Aggravating factors: turning head to the left; night time; lift heavy bag of vegetables or push something better;  looking down or up; cooking Relieving factors: nothing identified  PRECAUTIONS: None   WEIGHT BEARING RESTRICTIONS: No  FALLS:  Has patient fallen in last 6 months? 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  DIAGNOSTIC FINDINGS:  CERVICAL SPINE -2023  COMPARISON:  None. FINDINGS: Vertebral body height and alignment are maintained. Intervertebral disc space height is normal. Minimal anterior endplate spurring is noted. Mild facet degenerative change is seen at C7-T1. Prevertebral soft tissues appear normal. Lung apices are clear. IMPRESSION: Mild appearing cervical degenerative change.  Otherwise negative.  PATIENT SURVEYS:  NDI 62% moderate to severe self perceived disability  COGNITION: Overall cognitive status: Within functional limits for tasks assessed  POSTURE: rounded shoulders  PALPATION: Very tender right upper traps, levator scap, rhobmoids, subocciptals Cervical joint hypomobility C4-7 PA and lateral glides painful    CERVICAL ROM:   Active ROM A/PROM (deg) eval  Flexion 25 pain  Extension 0 pain  Right lateral flexion 20 pain  Left lateral flexion 10 pain  Right rotation 15 pain  Left rotation 25 pain   (Blank  rows = not tested)  UPPER EXTREMITY WUJ:WJXBJ shoulder flexion 150, right shoulder abduction 145 painful; right shoulder internal rotation to right lateral hip painful  UPPER EXTREMITY MMT: grossly 4-/5 right shoulder, scapula  CERVICAL SPECIAL TESTS:  + upper limb tension bilaterally: right > left No improvement in symptoms with cervical distraction   TREATMENT DATE:  11/20/23  Establish HEP cervical and shoulder submax isometrics ***considering DN (upper traps, cervical multifidi, suboccipitals);    11/18/23                                                                                                                              Given handout about DN, pt to consider for next time Patient instruction on good nutrition, light exercise, prioritization of sleep, meditation/mindfulness to promote healing and reduced inflammatory response   PATIENT EDUCATION:  Education details: Educated patient on anatomy and physiology of current symptoms, prognosis, plan of care as well as initial self care strategies to promote recovery Person educated: Patient Education method: Explanation Education comprehension: verbalized understanding  HOME EXERCISE PROGRAM: ***  ASSESSMENT:  CLINICAL IMPRESSION: ***  OBJECTIVE IMPAIRMENTS: decreased activity tolerance, decreased ROM, decreased strength, hypomobility, increased fascial restrictions, impaired perceived functional ability, increased muscle spasms, impaired UE functional use, and pain.   ACTIVITY LIMITATIONS: carrying, lifting, sitting, sleeping, bathing, dressing, reach over head, hygiene/grooming, and caring for others  PARTICIPATION LIMITATIONS: meal prep, cleaning,  laundry, driving, community activity, and occupation  PERSONAL FACTORS: Time since onset of injury/illness/exacerbation and 3+ comorbidities: prior history of neck and back pain, bil carpal tunnel syndrome with ongoing paresthesia and fibromyalgia  are also affecting  patient's functional outcome.   REHAB POTENTIAL: Good  CLINICAL DECISION MAKING: Evolving/moderate complexity  EVALUATION COMPLEXITY: Moderate   GOALS: Goals reviewed with patient? Yes  SHORT TERM GOALS: Target date: 12/16/2023    The patient will demonstrate knowledge of basic self care strategies and exercises to promote healing  Baseline:  Goal status: INITIAL  2.  The patient will report a 30% improvement in pain levels with functional activities which are currently difficult including turning her head for driving, carrying grocery bags, sleeping Baseline:  Goal status: INITIAL  3.  The patient will have improved shoulder abduction/elevation ROM to at least 155 degrees needed for grooming/dressing purposes as well as reaching high shelves  Baseline:  Goal status: INITIAL  4.  Improved cervical rotation ROM to 35 degrees needed for driving Baseline:  Goal status: INITIAL    LONG TERM GOALS: Target date: 01/13/2024   The patient will be independent in a safe self progression of a home exercise program to promote further recovery of function  Baseline:  Goal status: INITIAL  2.  The patient will report a 60% improvement in pain levels with functional activities which are currently difficult including turning her head for driving, carrying grocery bags, sleeping Baseline:  Goal status: INITIAL  3.  The patient will have grossly 4+/5 strength needed to carry grocery bags Baseline:  Goal status: INITIAL  4.  Right shoulder abduction 160 degrees and internal rotation to L1 needed for dressing tasks Baseline:  Goal status: INITIAL  5.  Neck Disability Index improved to   50  % indicating improved function with less pain Baseline:  Goal status: INITIAL    PLAN:  PT FREQUENCY: 2x/week  PT DURATION: 8 weeks  PLANNED INTERVENTIONS: [id number]- PT Re-evaluation, 97110-Therapeutic exercises, 97530- Therapeutic activity, 97112- Neuromuscular re-education, 97535- Self  Care, 13086- Manual therapy, [phone]- Aquatic Therapy, [id number]- Electrical stimulation (unattended), [phone]- Electrical stimulation (manual), [id number]- Ultrasound, 24401- Traction (mechanical), [id number]- Ionotophoresis 4mg /ml Dexamethasone, Patient/Family education, Taping, Dry Needling, Joint mobilization, Spinal manipulation, Spinal mobilization, Cryotherapy, and Moist heat  PLAN FOR NEXT SESSION: high level of symptom irritability; pt considering DN (upper traps, cervical multifidi, suboccipitals); cervical and shoulder submax isometrics  Solon Palm, PT  11/19/23 5:44 PM Phone: [phone] Fax: [phone]

## 2023-11-20 ENCOUNTER — Ambulatory Visit: Payer: Medicaid Other | Admitting: Physical Therapy

## 2023-11-22 ENCOUNTER — Encounter: Payer: Self-pay | Admitting: Family Medicine

## 2023-11-22 NOTE — Progress Notes (Signed)
 Ct negative.  If still having pain, can refer to GI

## 2023-11-23 ENCOUNTER — Other Ambulatory Visit: Payer: Self-pay | Admitting: *Deleted

## 2023-11-23 DIAGNOSIS — R1084 Generalized abdominal pain: Secondary | ICD-10-CM

## 2023-11-24 ENCOUNTER — Ambulatory Visit: Payer: Medicaid Other | Admitting: Rehabilitative and Restorative Service Providers"

## 2023-11-26 ENCOUNTER — Encounter: Payer: Self-pay | Admitting: Neurology

## 2023-11-26 ENCOUNTER — Ambulatory Visit: Payer: Medicaid Other | Admitting: Neurology

## 2023-11-26 VITALS — BP 145/84 | HR 80 | Ht 64.0 in | Wt 183.0 lb

## 2023-11-26 DIAGNOSIS — E66811 Obesity, class 1: Secondary | ICD-10-CM | POA: Diagnosis not present

## 2023-11-26 DIAGNOSIS — R351 Nocturia: Secondary | ICD-10-CM | POA: Diagnosis not present

## 2023-11-26 DIAGNOSIS — G4733 Obstructive sleep apnea (adult) (pediatric): Secondary | ICD-10-CM | POA: Diagnosis not present

## 2023-11-26 DIAGNOSIS — R519 Headache, unspecified: Secondary | ICD-10-CM

## 2023-11-26 DIAGNOSIS — G4719 Other hypersomnia: Secondary | ICD-10-CM | POA: Diagnosis not present

## 2023-11-26 NOTE — Progress Notes (Signed)
 Subjective:    Patient ID: Sherri Wolf is a 58 y.o. female.  HPI   Huston Foley, MD, PhD [facility] [address], [address] [address] [place], [place] [address]  Dear Dr. Ruthine Dose,   I saw your patient, Sherri Wolf, upon your kind request in my clinic today for initial consultation of her sleep in particular, evaluation of her prior diagnosis of obstructive sleep apnea.  The patient is unaccompanied today.  As you know, Sherri Wolf is a 58 year old female with an underlying medical history of hyperlipidemia, lower extremity edema, sinusitis, depression, fibromyalgia, reflux disease, hypertension, ovarian cyst, low back pain, migraine headaches, and mild obesity, who was previously diagnosed with obstructive sleep apnea and placed on PAP therapy.  I was able to review her prior home sleep test results.  She had testing while still residing in [place] on 06/01/2018 with a home sleep test that showed an AHI of 36.4/h, O2 nadir 81%.  Her weight at the time was 181 pounds and BMI was 31.  She has had some weight since then.  She reports that she was able to weight down to 165 until recently but had an extended stay in [place] for about 5 months and gained most of her weight back.  Not aware of any family history of sleep apnea.  She reports worsening snoring and apneas and occasional morning headaches which are described as dull, not like her previous migraines.  She has significant nocturia about 4-5 times per average night and does not wake up rested.  Epworth sleepiness score is 15 out of 24, fatigue severity score is 41 out of 63.  She would be retested and start home PAP therapy again.  She reports that she had a machine in 2019 and stopped using it in 2022 after she was not able to get updated supplies.   Her bedtime is around 10 and rise time around 8.  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  She has 2 stepchildren,  1 daughter and 1 son, daughter has 2 daughters.  She is a non-smoker and does not drink alcohol and limits her caffeine to half a cup of tea per day.  She reports having had a tonsillectomy as a child.   They do not have pets in the household.  They do not have a TV in the bedroom.  Her Past Medical History Is Significant For: Past Medical History:  Diagnosis Date   Acute non-recurrent maxillary sinusitis 09/08/2020   Bilateral carpal tunnel syndrome 10/28/2019   Colon polyp 10/27/2017   COVID-19 ruled out by laboratory testing 09/08/2020   Depression, recurrent (HCC) 10/21/2022   Edema leg 12/04/2021   Essential hypertension 05/28/2017   Formatting of this note is different from the original.  HTN, Started in June 2018  Had dry cough with Lisinopril.  Changed to Toprol XL 25 mg August 2018     Last Assessment & Plan:   Formatting of this note might be different from the original.  Assessment:   Adequately controlled on medical therapy.   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       Excessive weight gain 06/06/2015   Last Assessment & Plan: Formatting of this note might be different from the original. A/P Cause of recent sudden weight gain is difficult to pin point. All lab w/u is unremarkable for systemic disease like  HTN, DM, Hypothyroidism, renal disease (Nephrotic syndrome). Lupus and other connective tissue disease should be considered. Adv To have eval with internist and Rheumatologist.   Fibromyalgia 12/04/2021   Gastroesophageal reflux disease without esophagitis 12/04/2021   Hypercholesteremia 06/06/2015   Formatting of this note might be different from the original. Last Assessment & Plan: Assessment:  The condition is stable  No recent labs  Body mass index is 32.06 kg/m. Plan:  Continue dietary measures  Aim is to achieve LDL level  below 70  Continue regular exercise  Continue current medical management.  Check labs prior to  next visit has a current medication list which includes the fo   Hyperlipidemia    Hypertension 2017   resolved   Left ovarian cyst 10/27/2017   Low back pain 06/06/2015   Formatting of this note might be different from the original. Last Assessment & Plan: S: Still has right sided low back pain shooting down the leg. Also has rt shoulder pain which limits her arm movements. A/P Rec: Ortho eval, to r/o spinal stenosis. Last Assessment & Plan: Formatting of this note might be different from the original. S: Still has right sided low back pain shooting down the leg. A   Lower respiratory infection 09/08/2020   Migraine 10/27/2017   Formatting of this note might be different from the original. hx migraines, mri-mild periventricular white matter ischemic changes- 2018 June Formatting of this note might be different from the original. hx migraines, mri-mild periventricular white matter ischemic changes- 2018 June   Neck pain 10/28/2019   Numbness 10/28/2019   Obstructive sleep apnea (adult) (pediatric) 12/04/2021   Other chest pain 06/06/2015   Formatting of this note might be different from the original. 06/06/2015 EST echo 4 mins standard Bruce protocol. No CP Last Assessment & Plan: Formatting of this note might be different from the original. S: No cp. Still has shortness of breath while walking. A/P ReC: Pul evaluation, PFT,  to r/o asthma/ pul fibrosis.Echo is normal with no pulmonary HTN   Other specified postprocedural states 10/27/2017   Formatting of this note might be different from the original. hx of congential vaginal atresia   Pain in right knee 04/30/2018   Peripheral edema 01/07/2021   Persistent cough 03/02/2020   Prediabetes 07/01/2019   Primary osteoarthritis of both first carpometacarpal joints 10/28/2019   Pruritus 12/04/2021   Right ovarian cyst 10/27/2017   Trigger middle finger of left hand 10/28/2019   Vitamin D deficiency 12/30/2019    Her Past Surgical History Is  Significant For: Past Surgical History:  Procedure Laterality Date   TOTAL VAGINAL HYSTERECTOMY  2019    Her Family History Is Significant For: Family History  Problem Relation Age of Onset   Hypertension Mother    Hyperlipidemia Father    Hypertension Father    Lung disease Father    Snoring Father     Her Social History Is Significant For: Social History   Socioeconomic History   Marital status: Married    Spouse name: Not on file   Number of children: 0   Years of education: Not on file   Highest education level: GED or equivalent  Occupational History   Not on file  Tobacco Use   Smoking status: Never   Smokeless tobacco: Never  Vaping Use   Vaping status: Never Used  Substance and Sexual Activity   Alcohol use: Never   Drug use: Never   Sexual activity: Yes    Birth  control/protection: Surgical  Other Topics Concern   Not on file  Social History Narrative   Vegetarian & 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    Step son and daughter.  Grands-2    Left handed    Caffeine: 1/2 cup of tea every morning    Social Drivers of Health   Financial Resource Strain: Medium Risk (11/03/2023)   Overall Financial Resource Strain (CARDIA)    Difficulty of Paying Living Expenses: Somewhat hard  Food Insecurity: No Food Insecurity (11/03/2023)   Hunger Vital Sign    Worried About Running Out of Food in the Last Year: Never true    Ran Out of Food in the Last Year: Never true  Transportation Needs: No Transportation Needs (11/03/2023)   PRAPARE - Administrator, Civil Service (Medical): No    Lack of Transportation (Non-Medical): No  Physical Activity: Insufficiently Active (11/03/2023)   Exercise Vital Sign    Days of Exercise per Week: 2 days    Minutes of Exercise per Session: 30 min  Stress: No Stress Concern Present (11/03/2023)   [employer] of Occupational Health - Occupational Stress Questionnaire     Feeling of Stress : Only a little  Social Connections: Moderately Integrated (11/03/2023)   Social Connection and Isolation Panel [NHANES]    Frequency of Communication with Friends and Family: Twice a week    Frequency of Social Gatherings with Friends and Family: Three times a week    Attends Religious Services: More than 4 times per year    Active Member of Clubs or Organizations: No    Attends Banker Meetings: Not on file    Marital Status: Married    Her Allergies Are:  No Known Allergies:   Her Current Medications Are:  Outpatient Encounter Medications as of 11/26/2023  Medication Sig   azelastine (ASTELIN) 0.1 % nasal spray Place 2 sprays into both nostrils 2 (two) times daily.   Bempedoic Acid-Ezetimibe (NEXLIZET) 180-10 MG TABS Take 1 tablet by mouth daily in the afternoon.   cyanocobalamin (VITAMIN B12) 1000 MCG tablet Take 1 tablet by mouth daily.   hydrOXYzine (ATARAX) 25 MG tablet Take 1 tablet (25 mg total) by mouth as needed.   Multiple Vitamins-Minerals (ONCOVITE) TABS Take 1 capsule by mouth daily.   pantoprazole (PROTONIX) 20 MG tablet TAKE 1 TABLET (20 MG TOTAL) BY MOUTH 2 (TWO) TIMES DAILY BEFORE A MEAL. TAKE 1 PILL TWICE A DAY FOR 2 WEEKS, THEN 1 PILL EVERY MORNING. (Patient taking differently: Take 20 mg by mouth as needed. Take 1 pill twice a day for 2 weeks, then 1 pill every morning.)   rosuvastatin (CRESTOR) 20 MG tablet Take 1 tablet (20 mg total) by mouth daily.   Vitamin D, Ergocalciferol, (DRISDOL) 1.25 MG (50000 UNIT) CAPS capsule TAKE 1 CAPSULE (50,000 UNITS TOTAL) BY MOUTH EVERY 7 (SEVEN) DAYS   No facility-administered encounter medications on file as of 11/26/2023.  :   Review of Systems:  Out of a complete 14 point review of systems, all are reviewed and negative with the exception of these symptoms as listed below:  Review of Systems  Neurological:        Patient is here alone for sleep consult. Patient states she was diagnosed with  sleep apnea in 2018. She states she stopped using her CPAP in 2021 due to equipment failure. She did try to use the machine last year but it didn't work well.  She did not bring it with her. Patient as a Hotel manager. She needed new equipment and since she moved to [place] from [place] she needed to get established with a new doctor. Of late, she has been told by family that they hear her snoring more. She will also easily fall asleep. ESS 14 FSS     Objective:  Neurological Exam  Physical Exam Physical Examination:   Vitals:   11/26/23 1139  BP: (!) 145/84  Pulse: 80    General Examination: The patient is a very pleasant 58 y.o. female in no acute distress. She appears well-developed and well-nourished and well groomed.   HEENT: Normocephalic, atraumatic, pupils are equal, round and reactive to light, corrective eyeglasses in place.  Extraocular tracking is good without limitation to gaze excursion or nystagmus noted. Hearing is grossly intact. Face is symmetric with normal facial animation. Speech is clear with no dysarthria noted. There is no hypophonia. There is no lip, neck/head, jaw or voice tremor. Neck is supple with full range of passive and active motion. There are no carotid bruits on auscultation. Oropharynx exam reveals: No mouth dryness, good dental hygiene, moderate airway crowding secondary to airway entry, slightly prominent uvula which is slightly kinked.  Tonsils essentially absent with possible residual tissue on the right side.  Neck size of 15, minimal overbite noted.  Tongue protrudes centrally and palate elevates symmetrically.   Chest: Clear to auscultation without wheezing, rhonchi or crackles noted.  Heart: S1+S2+0, regular and normal without murmurs, rubs or gallops noted.   Abdomen: Soft, non-tender and non-distended.  Extremities: There is nonpitting puffiness in the distal upper and lower extremities bilaterally.   Skin: Warm and dry without trophic changes noted.    Musculoskeletal: exam reveals no obvious joint deformities.   Neurologically:  Mental status: The patient is awake, alert and oriented in all 4 spheres. Her immediate and remote memory, attention, language skills and fund of knowledge are appropriate. There is no evidence of aphasia, agnosia, apraxia or anomia. Speech is clear with normal prosody and enunciation. Thought process is linear. Mood is normal and affect is normal.  Cranial nerves II - XII are as described above under HEENT exam.  Motor exam: Normal bulk, strength and tone is noted. There is no obvious action or resting tremor.  Fine motor skills and coordination: grossly intact.  Cerebellar testing: No dysmetria or intention tremor. There is no truncal or gait ataxia.  Sensory exam: intact to light touch in the upper and lower extremities.  Gait, station and balance: She stands easily. No veering to one side is noted. No leaning to one side is noted. Posture is age-appropriate and stance is narrow based. Gait shows normal stride length and normal pace. No problems turning are noted.   Assessment and Plan:   In summary, Avantika Schlotterbeck is a very pleasant 58 y.o.-year old female with an underlying medical history of hyperlipidemia, lower extremity edema, sinusitis, depression, fibromyalgia, reflux disease, hypertension, ovarian cyst, low back pain, migraine headaches, and mild obesity, who presents for evaluation of her obstructive sleep apnea.  She was diagnosed test in 2019 while still residing in [place] and was started on PAP therapy, stopped using her machine in 2022.  She had severe obstructive sleep apnea at the time, weight has been more or less the same with some interim fluctuation.   While in the laboratory attended sleep study is typically considered "gold standard" for evaluation of sleep disordered breathing we mutually agreed to proceed with  a home sleep test at this time for reevaluation purposes. I had a long chat with the  patient about my findings and the diagnosis of sleep apnea, particularly OSA, its prognosis and treatment options. We talked about medical/conservative treatments, surgical interventions and non-pharmacological approaches for symptom control. I explained, in particular, the risks and ramifications of untreated moderate to severe OSA, especially with respect to developing cardiovascular disease down the road, including congestive heart failure (CHF), difficult to treat hypertension, cardiac arrhythmias (particularly A-fib), neurovascular complications including TIA, stroke and dementia. Even type 2 diabetes has, in part, been linked to untreated OSA. Symptoms of untreated OSA may include (but may not be limited to) daytime sleepiness, nocturia (i.e. frequent nighttime urination), memory problems, mood irritability and suboptimally controlled or worsening mood disorder such as depression and/or anxiety, lack of energy, lack of motivation, physical discomfort, as well as recurrent headaches, especially morning or nocturnal headaches. We talked about the importance of maintaining a healthy lifestyle and striving for healthy weight. We talked about the importance of striving for and maintaining good sleep hygiene. I recommended a sleep study at this time. I outlined the differences between a laboratory attended sleep study which is considered more comprehensive and accurate over the option of a home sleep test (HST); the latter may lead to underestimation of sleep disordered breathing in some instances and does not help with diagnosing upper airway resistance syndrome and is not accurate enough to diagnose primary central sleep apnea typically. I outlined possible surgical and non-surgical treatment options of OSA, including the use of a positive airway pressure (PAP) device (i.e. CPAP, AutoPAP/APAP or BiPAP in certain circumstances), a custom-made dental device (aka oral appliance, which would require a referral to a  specialist dentist or orthodontist typically, and is generally speaking not considered for patients with full dentures or edentulous state), upper airway surgical options, such as traditional UPPP (which is not considered a first-line treatment) or the Inspire device (hypoglossal nerve stimulator, which would involve a referral for consultation with an ENT surgeon, after careful selection, following inclusion criteria - also not first-line treatment). I explained the PAP treatment option to the patient in detail, as this is generally considered first-line treatment.  The patient indicated that she would be willing to try PAP therapy again, if the need arises. I explained the importance of being compliant with PAP treatment, not only for insurance purposes but primarily to improve patient's symptoms symptoms, and for the patient's long term health benefit, including to reduce Her cardiovascular risks longer-term.    We will pick up our discussion about the next steps and treatment options after testing.  We will keep her posted as to the test results by phone call and/or MyChart messaging where possible.  We will plan to follow-up in sleep clinic accordingly as well.  I answered all her questions today and the patient was in agreement.   I encouraged her to call with any interim questions, concerns, problems or updates or email [place] through MyChart.  Generally speaking, sleep test authorizations may take up to 2 weeks, sometimes less, sometimes longer, the patient is encouraged to get in touch with [place] if they do not hear back from the sleep lab staff directly within the next 2 weeks.  Thank you very much for allowing me to participate in the care of this nice patient. If I can be of any further assistance to you please do not hesitate to call me at [phone].  Sincerely,   Huston Foley,  MD, PhD

## 2023-11-26 NOTE — Patient Instructions (Addendum)

## 2023-11-27 ENCOUNTER — Ambulatory Visit: Payer: Medicaid Other

## 2023-12-02 ENCOUNTER — Ambulatory Visit: Payer: Medicaid Other

## 2023-12-04 ENCOUNTER — Ambulatory Visit (INDEPENDENT_AMBULATORY_CARE_PROVIDER_SITE_OTHER): Admitting: Neurology

## 2023-12-04 DIAGNOSIS — G4719 Other hypersomnia: Secondary | ICD-10-CM

## 2023-12-04 DIAGNOSIS — R519 Headache, unspecified: Secondary | ICD-10-CM

## 2023-12-04 DIAGNOSIS — G4733 Obstructive sleep apnea (adult) (pediatric): Secondary | ICD-10-CM

## 2023-12-04 DIAGNOSIS — R351 Nocturia: Secondary | ICD-10-CM

## 2023-12-04 DIAGNOSIS — E66811 Obesity, class 1: Secondary | ICD-10-CM

## 2023-12-11 ENCOUNTER — Encounter: Payer: Medicaid Other | Admitting: Rehabilitative and Restorative Service Providers"

## 2023-12-30 NOTE — Procedures (Signed)
 [facility]  HOME SLEEP TEST (SANSA) REPORT (Mail-Out Device):   STUDY DATE: 12/23/2023  DOB: 29-Apr-1966  MRN: [id number]  ORDERING CLINICIAN: Huston Foley, MD, PhD   REFERRING CLINICIAN: Jeani Sow, MD   CLINICAL INFORMATION/HISTORY: 58 year old female with an underlying medical history of hyperlipidemia, lower extremity edema, sinusitis, depression, fibromyalgia, reflux disease, hypertension, ovarian cyst, low back pain, migraine headaches, and mild obesity, who was previously diagnosed with obstructive sleep apnea and placed on PAP therapy.  She has not been on PAP therapy in about 3 years.  She presents for reevaluation and consideration of treatment again.  PATIENT'S LAST REPORTED EPWORTH SLEEPINESS SCORE (ESS): 15/24.  BMI (at the time of sleep clinic visit and/or test date): 31.4 kg/m  FINDINGS:   Study Protocol:    The SANSA single-point-of-skin-contact chest-worn sensor - an FDA cleared and DOT approved type 4 home sleep test device - measures eight physiological channels,  including blood oxygen saturation (measured via PPG [photoplethysmography]), EKG-derived heart rate, respiratory effort, chest movement (measured via accelerometer), snoring, body position, and actigraphy. The device is designed to be worn for up to 10 hours per study.   Sleep Summary:   Total Recording Time (hours, min): 9 hours, 35 min  Total Effective Sleep Time (hours, min):  6 hours, 17 min  Sleep Efficiency (%):    81%   Respiratory Indices:   Calculated sAHI (per hour):  23.7/hour  (utilizing the 4% desaturation criteria for obstructive hypopneas per Medicaid guidelines)        Oxygen Saturation Statistics:    Oxygen Saturation (%) Mean: 95.4%   Minimum oxygen saturation (%):                 66.5%   O2 Saturation Range (%): 66.5- 100%   Time below or at 88% saturation: 6 min   Pulse Rate Statistics:   Pulse Mean (bpm):    78/min    Pulse Range (64-  110/min)   Snoring: Mild to moderate  IMPRESSION/DIAGNOSES:   OSA (obstructive sleep apnea), moderate    RECOMMENDATIONS:   This home sleep test demonstrates moderate obstructive sleep apnea with a total AHI of 23.7/hour and O2 nadir of 66.5%.  Mild to moderate snoring was detected. Treatment with a positive airway pressure (PAP) device is recommended. The patient will be advised to proceed with an autoPAP titration/trial at home for now. A full night titration study may be considered to optimize treatment settings, monitor proper oxygen saturations and aid with improvement of tolerance and adherence, if needed down the road. Alternative treatment options may include a dental device through dentistry or orthodontics in selected patients or Inspire (hypoglossal nerve stimulator) in carefully selected patients (meeting inclusion criteria).  Concomitant weight loss is recommended (where clinically appropriate). Please note that untreated obstructive sleep apnea may carry additional perioperative morbidity. Patients with significant obstructive sleep apnea should receive perioperative PAP therapy and the surgeons and particularly the anesthesiologist should be informed of the diagnosis and the severity of the sleep disordered breathing. The patient should be cautioned not to drive, work at heights, or operate dangerous or heavy equipment when tired or sleepy. Review and reiteration of good sleep hygiene measures should be pursued with any patient. Other causes of the patient's symptoms, including circadian rhythm disturbances, an underlying mood disorder, medication effect and/or an underlying medical problem cannot be ruled out based on this test. Clinical correlation is recommended.  The patient and her referring provider will be notified of the  test results. The patient will be seen in follow up in sleep clinic at [facility].  I certify that I have reviewed the raw data recording prior to the issuance of  this report in accordance with the standards of the American Academy of Sleep Medicine (AASM).    INTERPRETING PHYSICIAN:   Huston Foley, MD, PhD Medical Director, [facility]) Diplomat, ABPN (Neurology and Sleep)   [facility] [address] [place], [place] [address] [phone]

## 2023-12-30 NOTE — Progress Notes (Signed)
 See procedure note.

## 2023-12-31 DIAGNOSIS — Z1231 Encounter for screening mammogram for malignant neoplasm of breast: Secondary | ICD-10-CM | POA: Diagnosis not present

## 2023-12-31 LAB — HM MAMMOGRAPHY

## 2023-12-31 NOTE — Addendum Note (Signed)
 Addended by: Huston Foley on: 12/31/2023 08:22 AM   Modules accepted: Orders

## 2024-01-01 ENCOUNTER — Encounter: Payer: Self-pay | Admitting: Family Medicine

## 2024-01-02 ENCOUNTER — Encounter: Payer: Self-pay | Admitting: Family Medicine

## 2024-01-04 ENCOUNTER — Encounter: Payer: Medicaid Other | Admitting: Rehabilitative and Restorative Service Providers"

## 2024-01-05 ENCOUNTER — Telehealth: Payer: Self-pay

## 2024-01-05 NOTE — Telephone Encounter (Addendum)
 I spoke with the patient and discussed her sleep study results. The patient agrees to start autopap. We discussed insurance compliance requirements which includes using the machine at least 4 hours at night and also being seen by our office between 30 and 90 days after setup. The patient's questions were answered. She is ok with referral to [facility]. Pt will watch for a call from them within 1 week. Patient would like a copy of her results mailed to her. She also scheduled initial follow-up for 03/24/24 at 1245 pm arrival 1230.   Referral sent to [facility]. Report sent to referring provider.

## 2024-01-05 NOTE — Telephone Encounter (Addendum)
 LVM for pt to call back for the below results per Dr. Omar Bibber.   ----- Message from Debbra Fairy sent at 12/31/2023  8:22 AM EDT ----- Patient referred by PCP, seen by me on 11/26/23, patient had a HST on 12/23/23.    Please call and notify the patient that the recent home sleep test showed obstructive sleep apnea in the moderate range. I recommend treatment in the form of autoPAP, which means, that we don't have to bring her in for a sleep study with CPAP, but will let her start using a so called autoPAP machine at home, which is a CPAP-like machine with self-adjusting pressures. We will send the order to a local DME company (of her choice, or as per insurance requirement). The DME representative will fit her with a mask, educate her on how to use the machine, how to put the mask on, etc. I have placed an order in the chart. Please send the order, talk to patient, send report to referring MD. We will need a FU in sleep clinic for 10 weeks post-PAP set up, please arrange that with me or one of our NPs. Also reinforce the need for compliance with treatment. Thanks,   Debbra Fairy, MD, PhD [facility])

## 2024-01-05 NOTE — Progress Notes (Signed)
 LVM (KRS)

## 2024-01-05 NOTE — Telephone Encounter (Signed)
 Pt has returned call to CMA for results.

## 2024-01-20 ENCOUNTER — Encounter: Payer: Self-pay | Admitting: Neurology

## 2024-01-20 NOTE — Telephone Encounter (Signed)
 I called [facility] to check on referral. They are unable to locate one. I have sent a high priority request to them to get order processed for the patient.

## 2024-01-21 NOTE — Telephone Encounter (Signed)
 Zott, [place] Grim, Daryle Amis L, RN; [place], Tammy Rio I will send this over as an urgent order Thank you

## 2024-02-04 DIAGNOSIS — G4733 Obstructive sleep apnea (adult) (pediatric): Secondary | ICD-10-CM | POA: Diagnosis not present

## 2024-02-09 ENCOUNTER — Other Ambulatory Visit: Payer: Self-pay | Admitting: Family Medicine

## 2024-02-10 NOTE — Telephone Encounter (Signed)
 Patient stated she is taking as needed, does not take every day, did not request refill. Refill request refused.

## 2024-02-21 DIAGNOSIS — G4733 Obstructive sleep apnea (adult) (pediatric): Secondary | ICD-10-CM | POA: Diagnosis not present

## 2024-02-22 ENCOUNTER — Encounter: Payer: Self-pay | Admitting: Physician Assistant

## 2024-02-22 ENCOUNTER — Ambulatory Visit (INDEPENDENT_AMBULATORY_CARE_PROVIDER_SITE_OTHER)
Admission: RE | Admit: 2024-02-22 | Discharge: 2024-02-22 | Disposition: A | Discharge status: Home / Self Care | Source: Ambulatory Visit | Attending: Physician Assistant | Admitting: Physician Assistant

## 2024-02-22 ENCOUNTER — Ambulatory Visit (INDEPENDENT_AMBULATORY_CARE_PROVIDER_SITE_OTHER): Admitting: Physician Assistant

## 2024-02-22 VITALS — BP 130/80 | HR 83 | Ht 64.0 in | Wt 186.0 lb

## 2024-02-22 DIAGNOSIS — M2550 Pain in unspecified joint: Secondary | ICD-10-CM

## 2024-02-22 DIAGNOSIS — R140 Abdominal distension (gaseous): Secondary | ICD-10-CM

## 2024-02-22 DIAGNOSIS — R1032 Left lower quadrant pain: Secondary | ICD-10-CM

## 2024-02-22 DIAGNOSIS — M25552 Pain in left hip: Secondary | ICD-10-CM | POA: Diagnosis not present

## 2024-02-22 DIAGNOSIS — K5900 Constipation, unspecified: Secondary | ICD-10-CM | POA: Diagnosis not present

## 2024-02-22 NOTE — Patient Instructions (Signed)
 Referral placed for allergist.   Start Miralax 1 capful daily in 8 ounces of liquid.  Your provider has requested that you have an abdominal x ray before leaving today. Please go to the basement floor to our Radiology department for the test.  You have been given a testing kit to check for small intestine bacterial overgrowth (SIBO) which is completed by a company named [facility]. Make sure to return your test in the mail using the return mailing label given to you along with the kit. The test order, your demographic and insurance information have all already been sent to the company. [facility] will collect an upfront charge of $99.74 for commercial insurance plans and $209.74 if you are paying cash. Make sure to discuss with [facility] PRIOR to having the test to see if they have gotten information from your insurance company as to how much your testing will cost out of pocket, if any. Please contact [facility] at phone number [phone] to get instructions regarding how to perform the test as our office is unable to give specific testing instructions.  _______________________________________________________  If your blood pressure at your visit was 140/90 or greater, please contact your primary care physician to follow up on this.  _______________________________________________________  If you are age 57 or older, your body mass index should be between 23-30. Your Body mass index is 31.93 kg/m. If this is out of the aforementioned range listed, please consider follow up with your Primary Care Provider.  If you are age 66 or younger, your body mass index should be between 19-25. Your Body mass index is 31.93 kg/m. If this is out of the aformentioned range listed, please consider follow up with your Primary Care Provider.   ________________________________________________________  The [facility] providers would like to encourage you to use MYCHART to communicate with  providers for non-urgent requests or questions.  Due to long hold times on the telephone, sending your provider a message by [facility] may be a faster and more efficient way to get a response.  Please allow 48 business hours for a response.  Please remember that this is for non-urgent requests.  _______________________________________________________

## 2024-02-22 NOTE — Progress Notes (Signed)
 Chief Complaint: Left lower quadrant pain, constipation  HPI:    Sherri Wolf is a 58 year old [place] female with a past medical history as listed below, recently followed by digestive health, who was referred to me by Christel Cousins, MD for a complaint of left lower quadrant pain and constipation.     12/08/2017 colonoscopy for screening done with a pediatric colonoscope with findings of one 3 mm polyp in the transverse colon and external hemorrhoids.  Pathology showed mild hyperplastic features and focal lymphoid aggregate.  Repeat would be recommended in 10 years.    02/02/2019 EGD was normal.    04/12/21 EGD for belching with irregular Z-line 38 cm from incisors, one 2 mm sessile polyp in the lower third of the esophagus, small hiatal hernia, mild patchy erythematous mucosa in the body and antrum.  Biopsies showed duodenal mucosa with preserved villous architecture, gastric antral and oxyntic body type mucosa with no significant histopathologic abnormalities in the stomach, junctional cardia mucosa with acute chronic inflammation and fovea gland hyperplasia in the GE junction, no evidence of intestinal metaplasia and a papillomata's hyperplasia in the esophagus polyp.    11/21/2023 CT the abdomen pelvis with contrast with no acute abnormality in the abdomen and pelvis, colonic diverticulosis without findings of acute diverticulitis.    Today, patient presents to clinic and explains that she has had left lower quadrant pain for the past 7 months, sometimes it is tender even to touch.  Tells me that previously she had an issue with constipation which she thought was related but she started doing prunes at night and feels like now she has a bowel movement every day, typically 2-3 times a day, but does not feel like it is complete.  There was a time that she use MiraLAX daily but did not want to get "hooked".  Does describe a lot of gas bloating and belching.  She is maintained on a vegetarian diet for  religious reasons.  Tells me she was previously worked up for all of this and EGD was normal.  She did have a recent CT which did not show diverticulitis.    Interestingly this left lower quadrant pain can also shoot down into her hip, apparently had Cortisone injection in her right hip previously for some hip discomfort.  Pain does seem somewhat worse when she is walking.    Also relays some peripheral edema.    Denies fever, chills, weight loss or blood in her stool.  Past Medical History:  Diagnosis Date   Acute non-recurrent maxillary sinusitis 09/08/2020   Bilateral carpal tunnel syndrome 10/28/2019   Colon polyp 10/27/2017   COVID-19 ruled out by laboratory testing 09/08/2020   Depression, recurrent (HCC) 10/21/2022   Edema leg 12/04/2021   Essential hypertension 05/28/2017   Formatting of this note is different from the original.  HTN, Started in June 2018  Had dry cough with Lisinopril.  Changed to Toprol  XL 25 mg August 2018     Last Assessment & Plan:   Formatting of this note might be different from the original.  Assessment:   Adequately controlled on medical therapy.   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       Excessive weight gain 06/06/2015   Last Assessment & Plan: Formatting of this note might be different from the original. A/P Cause of recent sudden weight gain is difficult to pin point. All lab w/u is unremarkable for  systemic disease like HTN, DM, Hypothyroidism, renal disease (Nephrotic syndrome). Lupus and other connective tissue disease should be considered. Adv To have eval with internist and Rheumatologist.   Fibromyalgia 12/04/2021   Gastroesophageal reflux disease without esophagitis 12/04/2021   Hypercholesteremia 06/06/2015   Formatting of this note might be different from the original. Last Assessment & Plan: Assessment:  The condition is stable  No recent labs  Body mass index is 32.06 kg/m. Plan:   Continue dietary measures  Aim is to achieve LDL level  below 70  Continue regular exercise  Continue current medical management.  Check labs prior to next visit has a current medication list which includes the fo   Hyperlipidemia    Hypertension 2017   resolved   Left ovarian cyst 10/27/2017   Low back pain 06/06/2015   Formatting of this note might be different from the original. Last Assessment & Plan: S: Still has right sided low back pain shooting down the leg. Also has rt shoulder pain which limits her arm movements. A/P Rec: Ortho eval, to r/o spinal stenosis. Last Assessment & Plan: Formatting of this note might be different from the original. S: Still has right sided low back pain shooting down the leg. A   Lower respiratory infection 09/08/2020   Migraine 10/27/2017   Formatting of this note might be different from the original. hx migraines, mri-mild periventricular white matter ischemic changes- 2018 June Formatting of this note might be different from the original. hx migraines, mri-mild periventricular white matter ischemic changes- 2018 June   Neck pain 10/28/2019   Numbness 10/28/2019   Obstructive sleep apnea (adult) (pediatric) 12/04/2021   Other chest pain 06/06/2015   Formatting of this note might be different from the original. 06/06/2015 EST echo 4 mins standard Bruce protocol. No CP Last Assessment & Plan: Formatting of this note might be different from the original. S: No cp. Still has shortness of breath while walking. A/P ReC: Pul evaluation, PFT,  to r/o asthma/ pul fibrosis.Echo is normal with no pulmonary HTN   Other specified postprocedural states 10/27/2017   Formatting of this note might be different from the original. hx of congential vaginal atresia   Pain in right knee 04/30/2018   Peripheral edema 01/07/2021   Persistent cough 03/02/2020   Prediabetes 07/01/2019   Primary osteoarthritis of both first carpometacarpal joints 10/28/2019   Pruritus 12/04/2021    Right ovarian cyst 10/27/2017   Trigger middle finger of left hand 10/28/2019   Vitamin D  deficiency 12/30/2019    Past Surgical History:  Procedure Laterality Date   TOTAL VAGINAL HYSTERECTOMY  2019    Current Outpatient Medications  Medication Sig Dispense Refill   azelastine  (ASTELIN ) 0.1 % nasal spray Place 2 sprays into both nostrils 2 (two) times daily. 30 mL 12   Bempedoic Acid-Ezetimibe (NEXLIZET ) 180-10 MG TABS Take 1 tablet by mouth daily in the afternoon. 90 tablet 3   cyanocobalamin  (VITAMIN B12) 1000 MCG tablet Take 1 tablet by mouth daily.     hydrOXYzine  (ATARAX ) 25 MG tablet Take 1 tablet (25 mg total) by mouth as needed. 90 tablet 1   Multiple Vitamins-Minerals (ONCOVITE) TABS Take 1 capsule by mouth daily.     OVER THE COUNTER MEDICATION Pt taking vitamin D  1 a day     pantoprazole  (PROTONIX ) 20 MG tablet TAKE 1 TABLET (20 MG TOTAL) BY MOUTH 2 (TWO) TIMES DAILY BEFORE A MEAL. TAKE 1 PILL TWICE A DAY FOR 2 WEEKS, THEN 1 PILL  EVERY MORNING. (Patient taking differently: Take 20 mg by mouth as needed. Take 1 pill twice a day for 2 weeks, then 1 pill every morning.) 180 tablet 0   rosuvastatin  (CRESTOR ) 20 MG tablet Take 1 tablet (20 mg total) by mouth daily. 90 tablet 1   Vitamin D , Ergocalciferol , (DRISDOL ) 1.25 MG (50000 UNIT) CAPS capsule TAKE 1 CAPSULE (50,000 UNITS TOTAL) BY MOUTH EVERY 7 (SEVEN) DAYS (Patient not taking: Reported on 02/22/2024) 12 capsule 0   No current facility-administered medications for this visit.    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    Step son and daughter.  Grands-2    Left handed    Caffeine: 1/2 cup of tea every morning    Social Drivers of Health   Financial Resource Strain: Medium Risk (11/03/2023)   Overall Financial Resource Strain (CARDIA)    Difficulty of Paying Living Expenses: Somewhat hard  Food Insecurity: No Food Insecurity (11/03/2023)   Hunger Vital Sign    Worried About Running Out of Food in the Last Year: Never true    Ran Out of Food in the Last Year: Never true  Transportation Needs: No Transportation Needs (11/03/2023)   PRAPARE - Administrator, Civil Service (Medical): No    Lack of Transportation (Non-Medical): No  Physical Activity: Insufficiently Active (11/03/2023)   Exercise Vital Sign    Days of Exercise per Week: 2 days    Minutes of Exercise per Session: 30 min  Stress: No Stress Concern Present (11/03/2023)   [employer] of Occupational Health - Occupational Stress Questionnaire    Feeling of Stress : Only a little  Social Connections: Moderately Integrated (11/03/2023)   Social Connection and Isolation Panel [NHANES]    Frequency of Communication with Friends and Family: Twice a week    Frequency of Social Gatherings with Friends and Family: Three times a week    Attends Religious Services: More than 4 times per year    Active Member of Clubs or Organizations: No    Attends Banker Meetings: Not on file    Marital Status: Married  Intimate Partner  Violence: Not At Risk (04/05/2021)   Received from [facility] visits prior to 11/22/2022., [facility] visits prior to 11/22/2022.   Humiliation, Afraid, Rape, and Kick questionnaire    Fear of Current  or Ex-Partner: No    Emotionally Abused: No    Physically Abused: No    Sexually Abused: No    Review of Systems:    Constitutional: No weight loss, fever or chills Skin: No rash  Cardiovascular: No chest pain Respiratory: No SOB Gastrointestinal: See HPI and otherwise negative Genitourinary: No dysuria  Neurological: No headache, dizziness or syncope Musculoskeletal: No new muscle or joint pain Hematologic: No bleeding  Psychiatric: No history of depression or anxiety   Physical Exam:  Vital signs: BP 130/80   Pulse 83   Ht 5\' 4"  (1.626 m)   Wt 186 lb (84.4 kg)   BMI 31.93 kg/m   Constitutional:   Pleasant [place] female appears to be in NAD, Well developed, Well nourished, alert and cooperative Head:  Normocephalic and atraumatic. Eyes:   PEERL, EOMI. No icterus. Conjunctiva pink. Ears:  Normal auditory acuity. Neck:  Supple Throat: Oral cavity and pharynx without inflammation, swelling or lesion.  Respiratory: Respirations even and unlabored. Lungs clear to auscultation bilaterally.   No wheezes, crackles, or rhonchi.  Cardiovascular: Normal S1, S2. No MRG. Regular rate and rhythm.  Gastrointestinal:  Soft, nondistended, nontender. No rebound or guarding.  Decreased bowel sounds all 4 quadrants no appreciable masses or hepatomegaly. Rectal:  Not performed.  Msk:  Symmetrical without gross deformities. + 1+ peripheral edema to level of the shin, Left hip pain, ttp Neurologic:  Alert and  oriented x4;  grossly normal neurologically.  Skin:   Dry and intact without significant lesions or rashes. Psychiatric:  Demonstrates good judgement and reason without abnormal affect or behaviors.  RELEVANT LABS AND IMAGING: CBC    Component  Value Date/Time   WBC 5.9 11/04/2023 0953   RBC 4.68 11/04/2023 0953   HGB 12.8 11/04/2023 0953   HCT 38.4 11/04/2023 0953   PLT 338.0 11/04/2023 0953   MCV 81.9 11/04/2023 0953   MCH 27.7 08/13/2022 1621   MCHC 33.3 11/04/2023 0953   RDW 13.5 11/04/2023 0953   LYMPHSABS 1.8 11/04/2023 0953   MONOABS 0.4 11/04/2023 0953   EOSABS 0.2 11/04/2023 0953   BASOSABS 0.0 11/04/2023 0953    CMP     Component Value Date/Time   NA 139 11/04/2023 0953   K 4.2 11/04/2023 0953   CL 102 11/04/2023 0953   CO2 28 11/04/2023 0953   GLUCOSE 104 (H) 11/04/2023 0953   BUN 11 11/04/2023 0953   CREATININE 0.75 11/04/2023 0953   CREATININE 0.80 08/13/2022 1621   CALCIUM  9.3 11/04/2023 0953   PROT 7.6 11/04/2023 0953   ALBUMIN 4.2 11/04/2023 0953   AST 21 11/04/2023 0953   ALT 17 11/04/2023 0953   ALKPHOS 99 11/04/2023 0953   BILITOT 0.6 11/04/2023 0953    Assessment: 1.  Left lower quadrant pain: For the past 7 months, CTAP reviewed and negative for diverticulitis or other etiology, she does have left hip pain which is very tender to palpation and describes radiation of this down into her leg at times; suspect she may have an element of constipation +/- hip pain with referred tenderness 2.  Left hip pain: See above 3.  Constipation: Some better with the prunes in the evening, never feels completely empty; likely slow transit +/- IBS 4.  Bloating and gas: She is on a vegetarian diet, we discussed how this can add to bloating and gas, but this seems excessive per her; consider SIBO  Plan: 1.  Ordered SIBO breath testing for the patient. 2.  Ordered  two-view abdominal x-ray to see state of stool today 3.  Continue prunes, discussed adding in MiraLAX daily to help as well 4.  Discussed left hip pain, I think some of this may be radiating into her left lower quadrant.  She needs to go ahead and discuss this with her PCP and possibly even have further Cortisone injections in this side. 5.  Also  discussed allergy testing, patient is interested to see if there are certain things she is eating which are causing more of an issue, apparently gets some generalized body aches and pains after eating certain things with increased gas. 6.  Patient asssigned to Dr. Nandigam today.  She will follow in clinic with me in 2 to 3 months. 7.  After patient left I was able to review colonoscopy report and pathology, she had a hyperplastic polyp removed in 2019, recall should be placed for 10 years, 12/09/2027 unless we need to further pursue evaluation of this left lower quadrant pain.  Reginal Capra, PA-C [facility] 02/22/2024, 8:47 AM  Cc: Christel Cousins, MD

## 2024-02-24 ENCOUNTER — Ambulatory Visit: Payer: Self-pay | Admitting: Physician Assistant

## 2024-03-13 IMAGING — DX DG LUMBAR SPINE 2-3V
3 series · 3 of 3 positions shown · non-contrast
Comparison: None.

CLINICAL DATA: Worsening low back pain for 6 months. No known
injury.

EXAM:
LUMBAR SPINE - 2-3 VIEW

[l-spine ap]
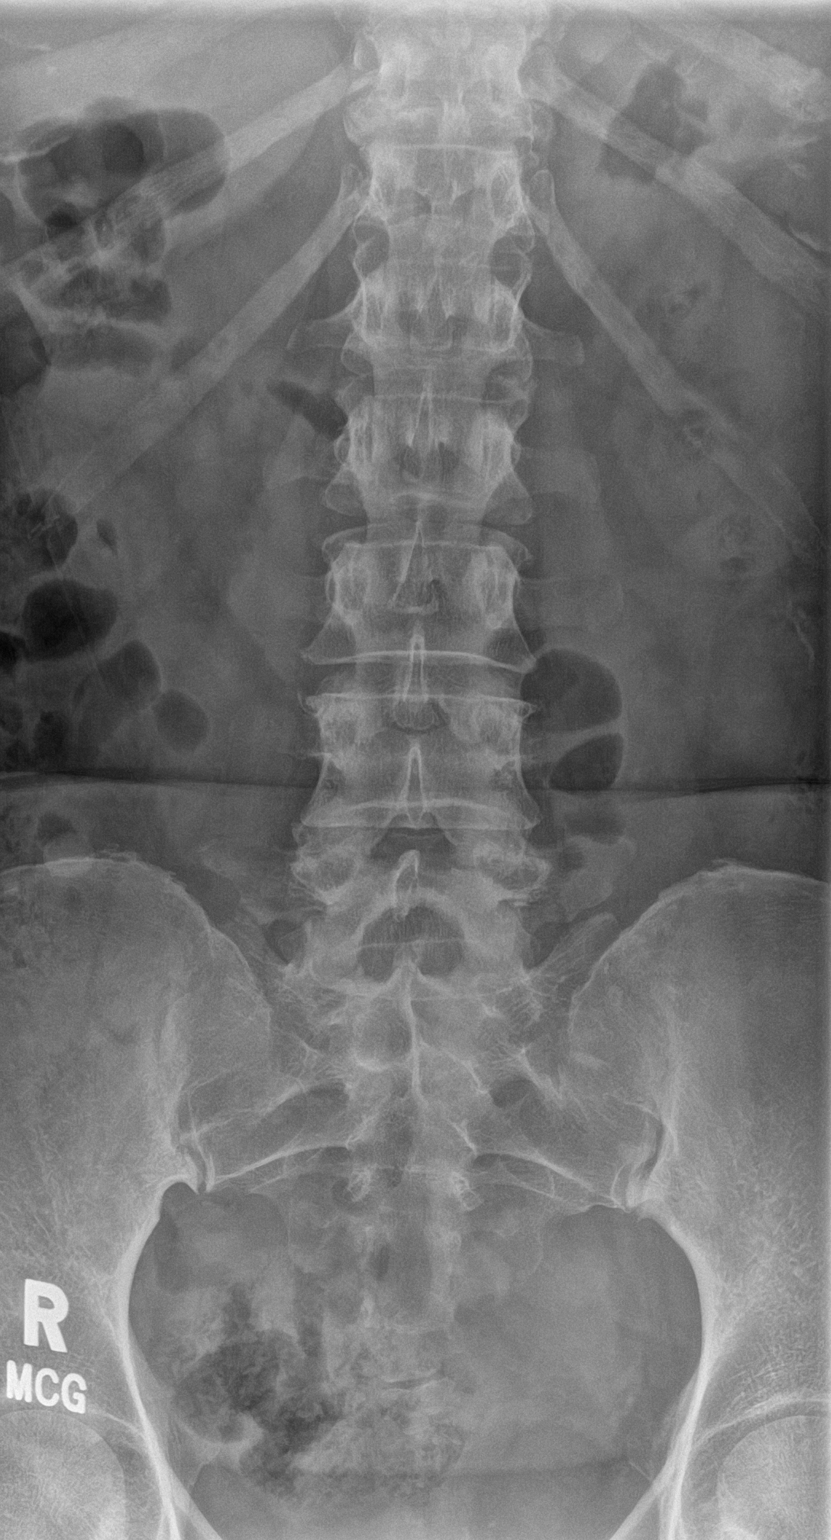

[l-spine lateral]
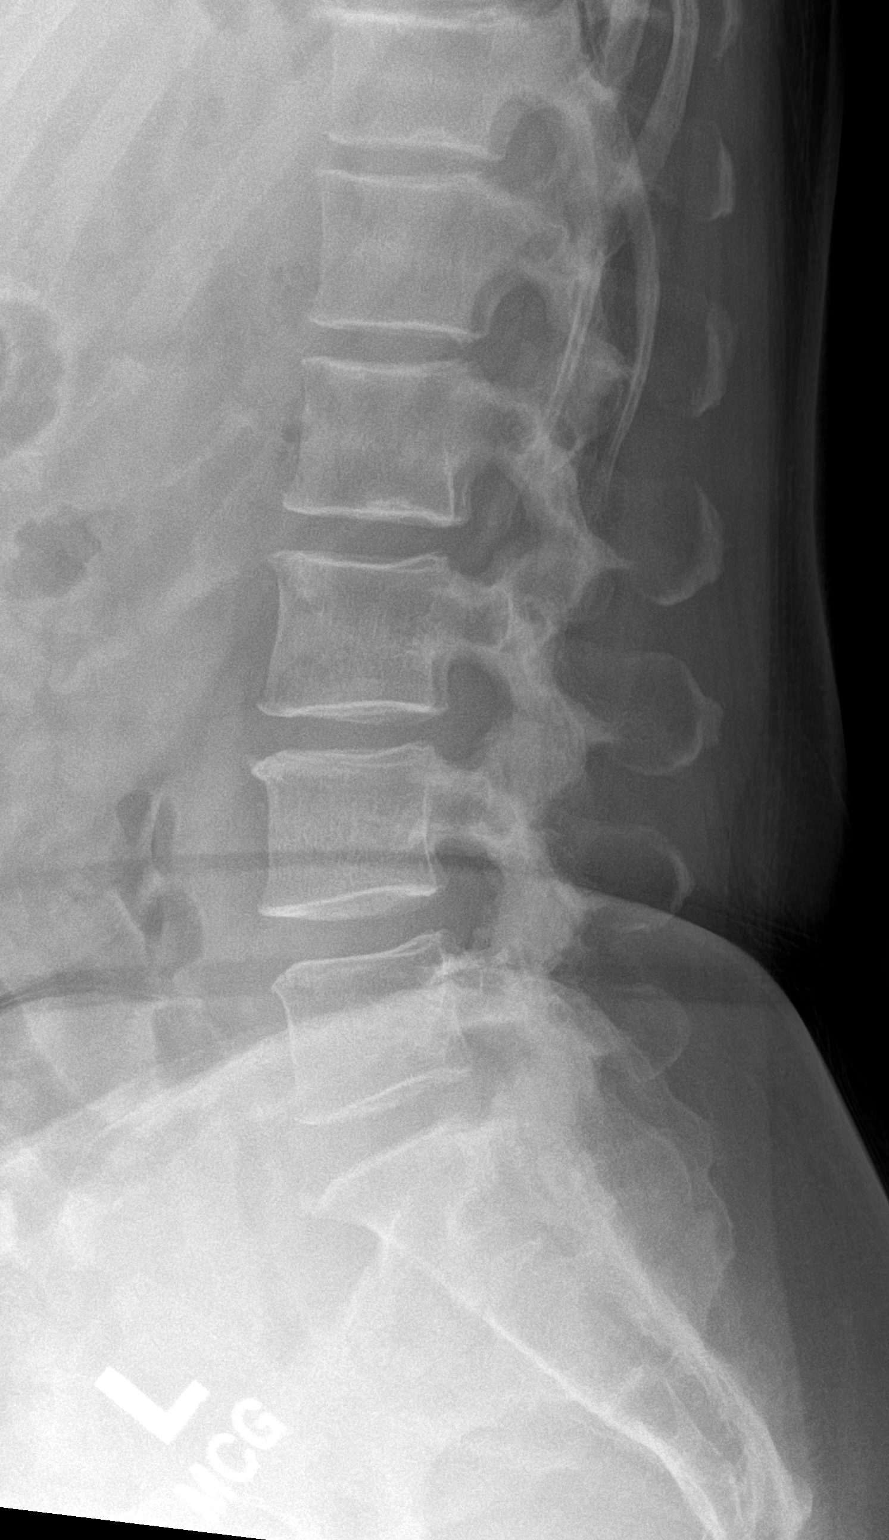

[l-spine spot]
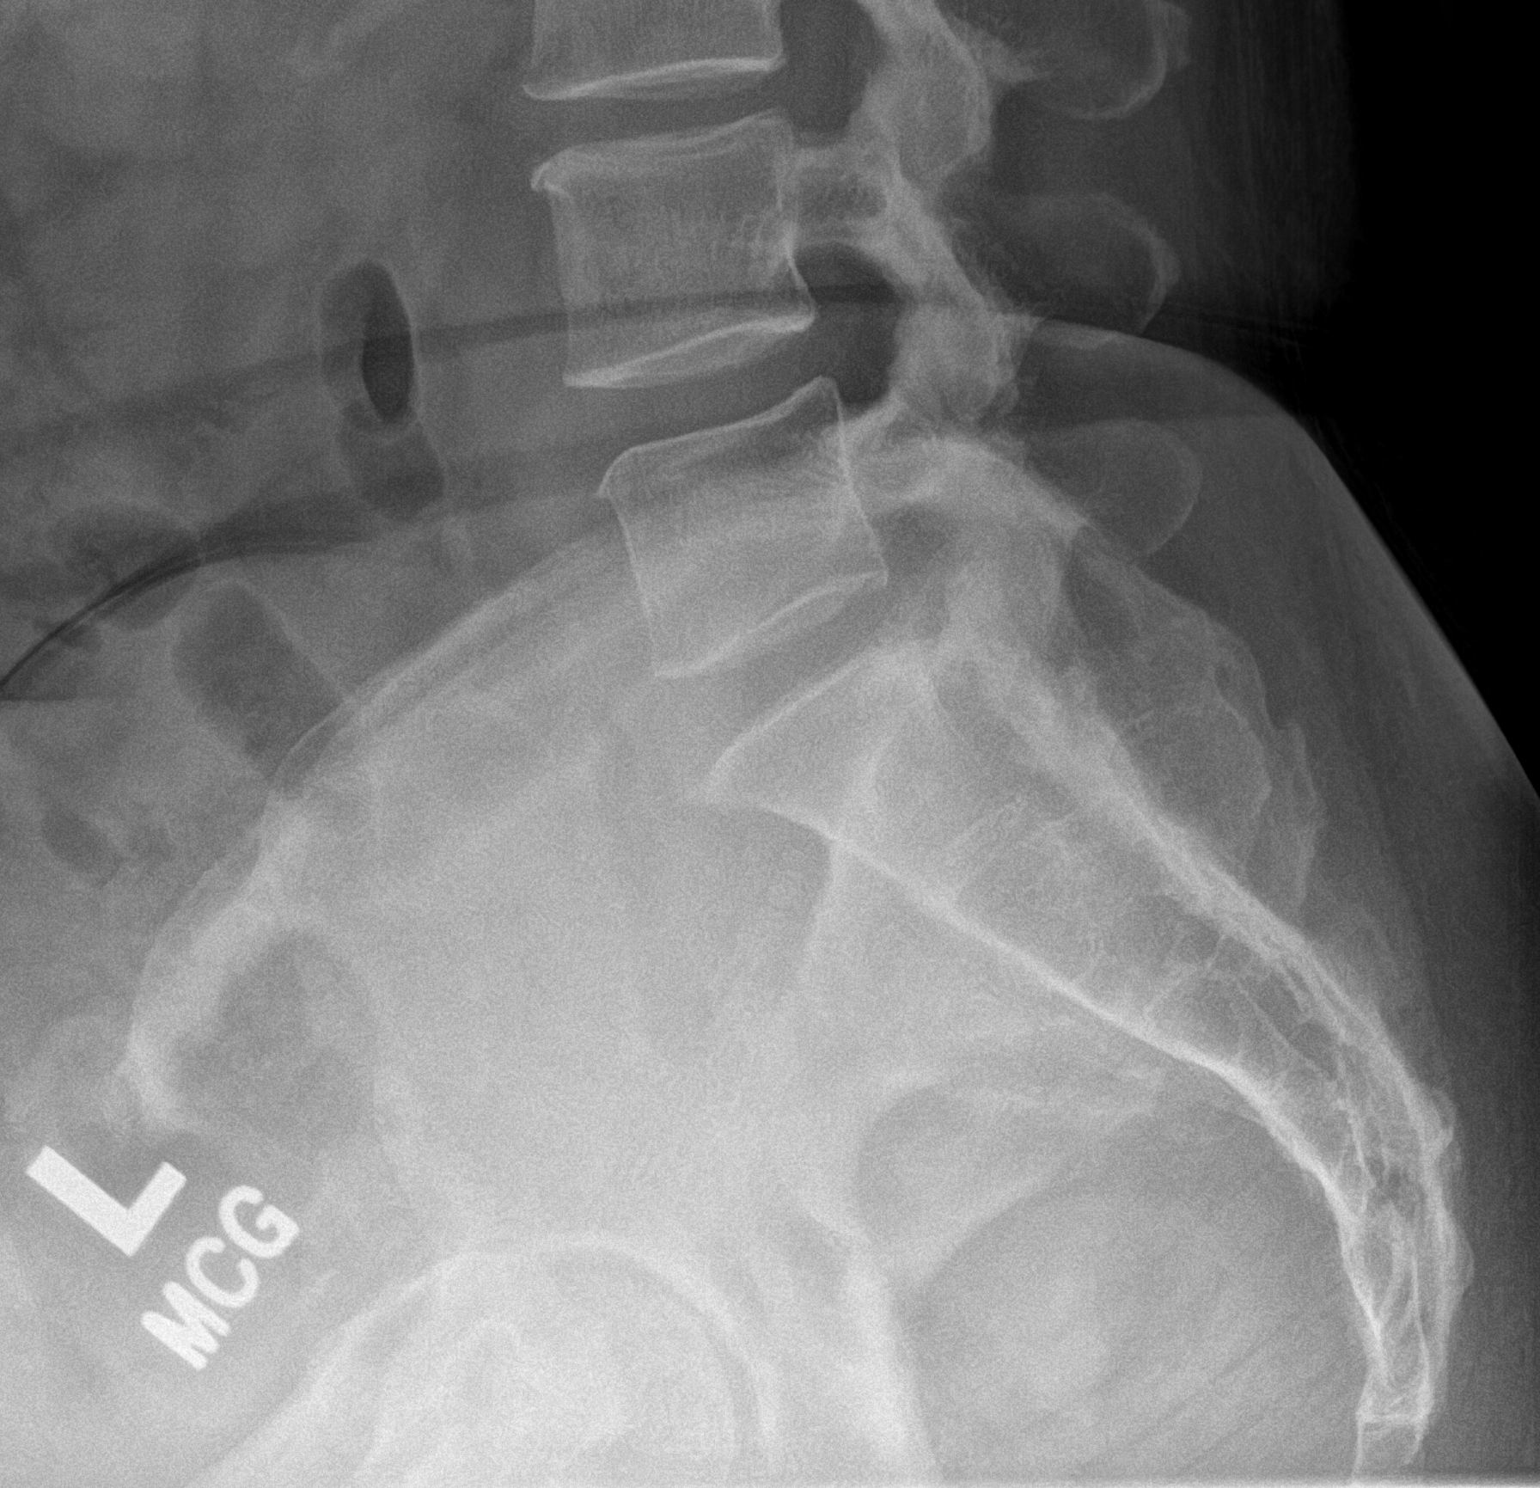

[3 of 3 positions shown; findings below may reference images not displayed]

FINDINGS: There is no evidence of lumbar spine fracture. Alignment is normal.
Intervertebral disc spaces are maintained. Mild appearing facet
degenerative disease L3-4,
IMPRESSION: Negative exam.

## 2024-03-24 ENCOUNTER — Ambulatory Visit: Admitting: Neurology

## 2024-03-31 ENCOUNTER — Ambulatory Visit: Admitting: Allergy & Immunology

## 2024-04-25 ENCOUNTER — Telehealth: Payer: Self-pay

## 2024-04-25 ENCOUNTER — Other Ambulatory Visit: Payer: Self-pay | Admitting: *Deleted

## 2024-04-25 ENCOUNTER — Ambulatory Visit: Admitting: Neurology

## 2024-04-25 ENCOUNTER — Encounter: Payer: Self-pay | Admitting: Neurology

## 2024-04-25 VITALS — BP 128/74 | HR 74 | Resp 15 | Ht 64.0 in | Wt 186.1 lb

## 2024-04-25 DIAGNOSIS — G4733 Obstructive sleep apnea (adult) (pediatric): Secondary | ICD-10-CM | POA: Diagnosis not present

## 2024-04-25 MED ORDER — ROSUVASTATIN CALCIUM 20 MG PO TABS: 20.0000 mg | ORAL_TABLET | Freq: Every day | ORAL | 1 refills | Status: DC

## 2024-04-25 MED ORDER — AZELASTINE HCL 0.1 % NA SOLN: 2.0000 | Freq: Two times a day (BID) | NASAL | 1 refills | Status: DC

## 2024-04-25 MED ORDER — HYDROXYZINE HCL 25 MG PO TABS: 25.0000 mg | ORAL_TABLET | ORAL | 1 refills | Status: DC | PRN

## 2024-04-25 NOTE — Progress Notes (Signed)
 Patient: Sherri Wolf Date of Birth: [birth year]  Reason for Visit: Follow up History from: Patient Primary Neurologist: Buck  ASSESSMENT AND PLAN 58 y.o. year old female   OSA on CPAP  Good compliance with CPAP. Continue nightly use minimum 4 hours, current settings. Order mask refit, would like to try nasal pillow mask. Will have to monitor for snoring. Also, we discussed increasing pressure range from 5-12 to 5-13 cm water, since staying around 10.9, max 11.6 and still feeling tired in the morning. Advised her to reach out in about 4-6 weeks after using new mask and see how doing. Follow up in 6 months VV.   HISTORY OF PRESENT ILLNESS: Today 04/25/24 Saw Dr. Buck in March 2025 with history of prior OSA with CPAP use.  Complaining of snoring, headaches, apneas.  HST 12/23/2023 showed moderate OSA with a total AHI of 23.7/hour and O2 nadir of 66.5%. CPAP download shows 83% compliance, > 4 hours 77%, AHI 1.6, leak 4.2, 5-12 cm water. Is using a nasal mask, leaks if not tight enough, but makes nose sore. Lately missed a few nights due to travel, she takes it with her, but often travel time is during the night. She continues to feel fatigued. With CPAP no longer snores, and she sleeps well at night, she can now sleep with her husband. 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 Doesn't know why she feels tired, taking her vitamins. CPAP setup 02/04/24. ESS 10.  HISTORY  11/26/23 SS: I saw your patient, Sherri Wolf, upon your kind request in my clinic today for initial consultation of her sleep in particular, evaluation of her prior diagnosis of obstructive sleep apnea.  The patient is unaccompanied today.  As you know, Sherri Wolf is a 58 year old female with an underlying medical history of hyperlipidemia, lower extremity edema, sinusitis, depression, fibromyalgia, reflux disease, hypertension, ovarian cyst, low back pain, migraine headaches, and mild obesity, who was previously  diagnosed with obstructive sleep apnea and placed on PAP therapy.  I was able to review her prior home sleep test results.  She had testing while still residing in [place]  on 06/01/2018 with a home sleep test that showed an AHI of 36.4/h, O2 nadir 81%.  Her weight at the time was 181 pounds and BMI was 31.  She has had some weight since then.  She reports that she was able to weight down to 165 until recently but had an extended stay in [place] for about 5 months and gained most of her weight back.  Not aware of any family history of sleep apnea.  She reports worsening snoring and apneas and occasional morning headaches which are described as dull, not like her previous migraines.  She has significant nocturia about 4-5 times per average night and does not wake up rested.  Epworth sleepiness score is 15 out of 24, fatigue severity score is 41 out of 63.  She would be retested and start home PAP therapy again.  She reports that she had a machine in 2019 and stopped using it in 2022 after she was not able to get updated supplies.   Her bedtime is around 10 and rise time around 8.  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  She has 2 stepchildren, 1 daughter and 1 son, daughter has 2 daughters.  She is a non-smoker and does not drink alcohol and limits her caffeine to half a  cup of tea per day.  She reports having had a tonsillectomy as a child.   They do not have pets in the household.  They do not have a TV in the bedroom.  REVIEW OF SYSTEMS: Out of a complete 14 system review of symptoms, the patient complains only of the following symptoms, and all other reviewed systems are negative.  See HPI  ALLERGIES: No Known Allergies  HOME MEDICATIONS: Outpatient Medications Prior to Visit  Medication Sig Dispense Refill   azelastine  (ASTELIN ) 0.1 % nasal spray Place 2 sprays into both nostrils 2 (two) times daily. 30 mL 12   Bempedoic  Acid-Ezetimibe (NEXLIZET ) 180-10 MG TABS Take 1 tablet by mouth daily in the afternoon. 90 tablet 3   cyanocobalamin  (VITAMIN B12) 1000 MCG tablet Take 1 tablet by mouth daily.     hydrOXYzine  (ATARAX ) 25 MG tablet Take 1 tablet (25 mg total) by mouth as needed. 90 tablet 1   Multiple Vitamins-Minerals (ONCOVITE) TABS Take 1 capsule by mouth daily.     OVER THE COUNTER MEDICATION Pt taking vitamin D  1 a day     pantoprazole  (PROTONIX ) 20 MG tablet TAKE 1 TABLET (20 MG TOTAL) BY MOUTH 2 (TWO) TIMES DAILY BEFORE A MEAL. TAKE 1 PILL TWICE A DAY FOR 2 WEEKS, THEN 1 PILL EVERY MORNING. (Patient taking differently: Take 20 mg by mouth as needed. Take 1 pill twice a day for 2 weeks, then 1 pill every morning.) 180 tablet 0   rosuvastatin  (CRESTOR ) 20 MG tablet Take 1 tablet (20 mg total) by mouth daily. 90 tablet 1   Vitamin D , Ergocalciferol , (DRISDOL ) 1.25 MG (50000 UNIT) CAPS capsule TAKE 1 CAPSULE (50,000 UNITS TOTAL) BY MOUTH EVERY 7 (SEVEN) DAYS 12 capsule 0   No facility-administered medications prior to visit.    PAST MEDICAL HISTORY: Past Medical History:  Diagnosis Date   Acute non-recurrent maxillary sinusitis 09/08/2020   Bilateral carpal tunnel syndrome 10/28/2019   Colon polyp 10/27/2017   COVID-19 ruled out by laboratory testing 09/08/2020   Depression, recurrent (HCC) 10/21/2022   Edema leg 12/04/2021   Essential hypertension 05/28/2017   Formatting of this note is different from the original.  HTN, Started in June 2018  Had dry cough with Lisinopril.  Changed to Toprol  XL 25 mg August 2018     Last Assessment & Plan:   Formatting of this note might be different from the original.  Assessment:   Adequately controlled on medical therapy.   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       Excessive weight gain 06/06/2015   Last Assessment & Plan: Formatting of this note might be different from the original. A/P Cause of recent sudden  weight gain is difficult to pin point. All lab w/u is unremarkable for systemic disease like HTN, DM, Hypothyroidism, renal disease (Nephrotic syndrome). Lupus and other connective tissue disease should be considered. Adv To have eval with internist and Rheumatologist.   Fibromyalgia 12/04/2021   Gastroesophageal reflux disease without esophagitis 12/04/2021   Hypercholesteremia 06/06/2015   Formatting of this note might be different from the original. Last Assessment & Plan: Assessment:  The condition is stable  No recent labs  Body mass index is 32.06 kg/m. Plan:  Continue dietary measures  Aim is to achieve LDL level  below 70  Continue regular exercise  Continue current medical management.  Check labs prior to next visit has a current medication  list which includes the fo   Hyperlipidemia    Hypertension 2017   resolved   Left ovarian cyst 10/27/2017   Low back pain 06/06/2015   Formatting of this note might be different from the original. Last Assessment & Plan: S: Still has right sided low back pain shooting down the leg. Also has rt shoulder pain which limits her arm movements. A/P Rec: Ortho eval, to r/o spinal stenosis. Last Assessment & Plan: Formatting of this note might be different from the original. S: Still has right sided low back pain shooting down the leg. A   Lower respiratory infection 09/08/2020   Migraine 10/27/2017   Formatting of this note might be different from the original. hx migraines, mri-mild periventricular white matter ischemic changes- 2018 June Formatting of this note might be different from the original. hx migraines, mri-mild periventricular white matter ischemic changes- 2018 June   Neck pain 10/28/2019   Numbness 10/28/2019   Obstructive sleep apnea (adult) (pediatric) 12/04/2021   Other chest pain 06/06/2015   Formatting of this note might be different from the original. 06/06/2015 EST echo 4 mins standard Bruce protocol. No CP Last Assessment & Plan:  Formatting of this note might be different from the original. S: No cp. Still has shortness of breath while walking. A/P ReC: Pul evaluation, PFT,  to r/o asthma/ pul fibrosis.Echo is normal with no pulmonary HTN   Other specified postprocedural states 10/27/2017   Formatting of this note might be different from the original. hx of congential vaginal atresia   Pain in right knee 04/30/2018   Peripheral edema 01/07/2021   Persistent cough 03/02/2020   Prediabetes 07/01/2019   Primary osteoarthritis of both first carpometacarpal joints 10/28/2019   Pruritus 12/04/2021   Right ovarian cyst 10/27/2017   Trigger middle finger of left hand 10/28/2019   Vitamin D  deficiency 12/30/2019    PAST SURGICAL HISTORY: Past Surgical History:  Procedure Laterality Date   TOTAL VAGINAL HYSTERECTOMY  2019    FAMILY HISTORY: Family History  Problem Relation Age of Onset   Hypertension Mother    Hyperlipidemia Father    Hypertension Father    Lung disease Father    Snoring Father    Colon cancer Neg Hx    Stomach cancer Neg Hx    Esophageal cancer Neg Hx     SOCIAL HISTORY: Social History   Socioeconomic History   Marital status: Married    Spouse name: Not on file   Number of children: 0   Years of education: Not on file   Highest education level: GED or equivalent  Occupational History   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    Step son and daughter.  Grands-2    Left handed    Caffeine: 1/2 cup of tea every morning    Social Drivers of Health   Financial Resource Strain: Medium Risk (11/03/2023)    Overall Financial Resource Strain (CARDIA)    Difficulty of Paying Living Expenses: Somewhat hard  Food Insecurity: No Food Insecurity (11/03/2023)   Hunger Vital  Sign    Worried About Programme researcher, broadcasting/film/video in the Last Year: Never true    Ran Out of Food in the Last Year: Never true  Transportation Needs: No Transportation Needs (11/03/2023)   PRAPARE - Administrator, Civil Service (Medical): No    Lack of Transportation (Non-Medical): No  Physical Activity: Insufficiently Active (11/03/2023)   Exercise Vital Sign    Days of Exercise per Week: 2 days    Minutes of Exercise per Session: 30 min  Stress: No Stress Concern Present (11/03/2023)   [employer] of Occupational Health - Occupational Stress Questionnaire    Feeling of Stress : Only a little  Social Connections: Moderately Integrated (11/03/2023)   Social Connection and Isolation Panel    Frequency of Communication with Friends and Family: Twice a week    Frequency of Social Gatherings with Friends and Family: Three times a week    Attends Religious Services: More than 4 times per year    Active Member of Clubs or Organizations: No    Attends Banker Meetings: Not on file    Marital Status: Married  Intimate Partner Violence: Not At Risk (04/05/2021)   Received from [facility] visits prior to 11/22/2022.   Humiliation, Afraid, Rape, and Kick questionnaire    Within the last year, have you been afraid of your partner or ex-partner?: No    Within the last year, have you been humiliated or emotionally abused in other ways by your partner or ex-partner?: No    Within the last year, have you been kicked, hit, slapped, or otherwise physically hurt by your partner or ex-partner?: No    Within the last year, have you been raped or forced to have any kind of sexual activity by your partner or ex-partner?: No    PHYSICAL EXAM  Vitals:   04/25/24 0748  BP: 128/74  Pulse: 74  Resp: 15   SpO2: 98%  Weight: 186 lb 1.1 oz (84.4 kg)  Height: 5' 4 (1.626 m)   Body mass index is 31.94 kg/m.  Generalized: Well developed, in no acute distress  Neurological examination  Mentation: Alert oriented to time, place, history taking. Follows all commands speech and language fluent Cranial nerve II-XII: Pupils were equal round reactive to light. Extraocular movements were full, visual field were full on confrontational test. Facial sensation and strength were normal.  Head turning and shoulder shrug  were normal and symmetric. Motor: The motor testing reveals 5 over 5 strength of all 4 extremities. Good symmetric motor tone is noted throughout.  Gait and station: Gait is normal.  DIAGNOSTIC DATA (LABS, IMAGING, TESTING) - I reviewed patient records, labs, notes, testing and imaging myself where available.  Lab Results  Component Value Date   WBC 5.9 11/04/2023   HGB 12.8 11/04/2023   HCT 38.4 11/04/2023   MCV 81.9 11/04/2023   PLT 338.0 11/04/2023      Component Value Date/Time   NA 139 11/04/2023 0953   K 4.2 11/04/2023 0953   CL 102 11/04/2023 0953   CO2 28 11/04/2023 0953   GLUCOSE 104 (H) 11/04/2023 0953   BUN 11 11/04/2023 0953   CREATININE 0.75 11/04/2023 0953   CREATININE 0.80 08/13/2022 1621   CALCIUM  9.3 11/04/2023 0953   PROT 7.6 11/04/2023 0953   ALBUMIN 4.2 11/04/2023 0953   AST 21 11/04/2023 0953   ALT 17 11/04/2023 0953   ALKPHOS 99 11/04/2023 0953   BILITOT 0.6  11/04/2023 0953   Lab Results  Component Value Date   CHOL 334 (H) 11/04/2023   HDL 55.40 11/04/2023   LDLCALC 227 (H) 11/04/2023   LDLDIRECT 219.0 06/30/2022   TRIG 254.0 (H) 11/04/2023   CHOLHDL 6 11/04/2023   Lab Results  Component Value Date   HGBA1C 6.3 11/04/2023   Lab Results  Component Value Date   VITAMINB12 942 (H) 10/21/2022   Lab Results  Component Value Date   TSH 2.18 11/04/2023   Lauraine Born, AGNP-C, DNP 04/25/2024, 8:16 AM [facility] [address], [address] [place], [place] [address] [phone]

## 2024-04-25 NOTE — Telephone Encounter (Signed)
-----   Message from Lauraine JINNY Born sent at 04/25/2024  8:21 AM EDT ----- Order to DME for mask refit please. Thanks

## 2024-04-25 NOTE — Patient Instructions (Addendum)
 Great to meet you today! Will order mask refit Continue using CPAP nightly minimum 4 hours  Let me know if still feeling fatigued, can adjust pressure

## 2024-04-25 NOTE — Telephone Encounter (Signed)
 Msg sent to dme thru [facility] msg:

## 2024-04-26 ENCOUNTER — Other Ambulatory Visit: Payer: Self-pay | Admitting: Family Medicine

## 2024-04-26 MED ORDER — HYDROXYZINE HCL 25 MG PO TABS: 25.0000 mg | ORAL_TABLET | Freq: Three times a day (TID) | ORAL | 1 refills | Status: DC | PRN

## 2024-05-04 ENCOUNTER — Encounter: Payer: Self-pay | Admitting: Internal Medicine

## 2024-05-04 ENCOUNTER — Ambulatory Visit: Admitting: Internal Medicine

## 2024-05-04 VITALS — BP 116/68 | HR 91 | Temp 97.9°F | Resp 20 | Ht 63.0 in | Wt 184.0 lb

## 2024-05-04 DIAGNOSIS — L299 Pruritus, unspecified: Secondary | ICD-10-CM | POA: Diagnosis not present

## 2024-05-04 DIAGNOSIS — I872 Venous insufficiency (chronic) (peripheral): Secondary | ICD-10-CM

## 2024-05-04 DIAGNOSIS — T781XXD Other adverse food reactions, not elsewhere classified, subsequent encounter: Secondary | ICD-10-CM | POA: Diagnosis not present

## 2024-05-04 DIAGNOSIS — R140 Abdominal distension (gaseous): Secondary | ICD-10-CM | POA: Diagnosis not present

## 2024-05-04 DIAGNOSIS — T781XXA Other adverse food reactions, not elsewhere classified, initial encounter: Secondary | ICD-10-CM

## 2024-05-04 NOTE — Progress Notes (Signed)
 NEW PATIENT Date of Service/Encounter:  05/04/24 Referring provider: Wendolyn Jenkins Jansky, MD Primary care provider: Wendolyn Jenkins Jansky, MD  Subjective:  Sherri Wolf is a 58 y.o. female  presenting today for evaluation of chronic abdominal pain and concern for food allergy  History obtained from: chart review and patient.   Discussed the use of AI scribe software for clinical note transcription with the patient, who gave verbal consent to proceed.  History of Present Illness Sherri Wolf is a 58 year old female who presents with abdominal pain for food allergy testing. She was referred by [facility]on for food allergy testing.  Abdominal pain and gastrointestinal symptoms - Persistent, significant abdominal pain, sometimes triggered by food intake - Pain is intermittent, occurs after eating and sometimes without eating, lasting up to two days - No specific food triggers identified despite dietary modifications, including lactose-free milk and low FODMAP foods - Excessive burping with any type of food, including milk - Consulted a nutritionist in 2023 or 2024 without relief - Follows a vegetarian diet  Constipation and stool retention - Chronic constipation - Abdominal x-ray revealed stool retention  Peripheral edema - Chronic swelling in feet, worsened by prolonged standing - Partial relief with elevation of feet - Compression stockings tried but are uncomfortable  Urticaria and pruritus - History of hives and itching - Hydroxyzine  used for symptom control with good response  - Reduction in salt intake helps with itching  Other allergic and atopic symptoms - No known medication allergies - No asthma or seasonal allergies since moving from [place]  in 2020  Musculoskeletal stiffness - Increasing body stiffness despite efforts to lose weight   Other allergy screening: Asthma: no Rhino conjunctivitis: no Food allergy: yes Medication allergy: no Hymenoptera allergy:  no Urticaria: yes Eczema:no History of recurrent infections suggestive of immunodeficency: no Vaccinations are up to date.   Past Medical History: Past Medical History:  Diagnosis Date   Acute non-recurrent maxillary sinusitis 09/08/2020   Bilateral carpal tunnel syndrome 10/28/2019   Colon polyp 10/27/2017   COVID-19 ruled out by laboratory testing 09/08/2020   Depression, recurrent (HCC) 10/21/2022   Edema leg 12/04/2021   Essential hypertension 05/28/2017   Formatting of this note is different from the original.  HTN, Started in June 2018  Had dry cough with Lisinopril.  Changed to Toprol  XL 25 mg August 2018     Last Assessment & Plan:   Formatting of this note might be different from the original.  Assessment:   Adequately controlled on medical therapy.   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       Excessive weight gain 06/06/2015   Last Assessment & Plan: Formatting of this note might be different from the original. A/P Cause of recent sudden weight gain is difficult to pin point. All lab w/u is unremarkable for systemic disease like HTN, DM, Hypothyroidism, renal disease (Nephrotic syndrome). Lupus and other connective tissue disease should be considered. Adv To have eval with internist and Rheumatologist.   Fibromyalgia 12/04/2021   Gastroesophageal reflux disease without esophagitis 12/04/2021   Hypercholesteremia 06/06/2015   Formatting of this note might be different from the original. Last Assessment & Plan: Assessment:  The condition is stable  No recent labs  Body mass index is 32.06 kg/m. Plan:  Continue dietary measures  Aim is to achieve LDL level  below 70  Continue regular exercise  Continue current medical management.  Check labs  prior to next visit has a current medication list which includes the fo   Hyperlipidemia    Hypertension 2017   resolved   Left ovarian cyst 10/27/2017   Low back pain 06/06/2015    Formatting of this note might be different from the original. Last Assessment & Plan: S: Still has right sided low back pain shooting down the leg. Also has rt shoulder pain which limits her arm movements. A/P Rec: Ortho eval, to r/o spinal stenosis. Last Assessment & Plan: Formatting of this note might be different from the original. S: Still has right sided low back pain shooting down the leg. A   Lower respiratory infection 09/08/2020   Migraine 10/27/2017   Formatting of this note might be different from the original. hx migraines, mri-mild periventricular white matter ischemic changes- 2018 June Formatting of this note might be different from the original. hx migraines, mri-mild periventricular white matter ischemic changes- 2018 June   Neck pain 10/28/2019   Numbness 10/28/2019   Obstructive sleep apnea (adult) (pediatric) 12/04/2021   Other chest pain 06/06/2015   Formatting of this note might be different from the original. 06/06/2015 EST echo 4 mins standard Bruce protocol. No CP Last Assessment & Plan: Formatting of this note might be different from the original. S: No cp. Still has shortness of breath while walking. A/P ReC: Pul evaluation, PFT,  to r/o asthma/ pul fibrosis.Echo is normal with no pulmonary HTN   Other specified postprocedural states 10/27/2017   Formatting of this note might be different from the original. hx of congential vaginal atresia   Pain in right knee 04/30/2018   Peripheral edema 01/07/2021   Persistent cough 03/02/2020   Prediabetes 07/01/2019   Primary osteoarthritis of both first carpometacarpal joints 10/28/2019   Pruritus 12/04/2021   Right ovarian cyst 10/27/2017   Trigger middle finger of left hand 10/28/2019   Vitamin D  deficiency 12/30/2019   Medication List:  Current Outpatient Medications  Medication Sig Dispense Refill   azelastine  (ASTELIN ) 0.1 % nasal spray Place 2 sprays into both nostrils 2 (two) times daily. 30 mL 1   Bempedoic  Acid-Ezetimibe (NEXLIZET ) 180-10 MG TABS Take 1 tablet by mouth daily in the afternoon. 90 tablet 3   cyanocobalamin  (VITAMIN B12) 1000 MCG tablet Take 1 tablet by mouth daily.     hydrOXYzine  (ATARAX ) 25 MG tablet Take 1 tablet (25 mg total) by mouth every 8 (eight) hours as needed. 90 tablet 1   Multiple Vitamins-Minerals (ONCOVITE) TABS Take 1 capsule by mouth daily.     OVER THE COUNTER MEDICATION Pt taking vitamin D  1 a day     pantoprazole  (PROTONIX ) 20 MG tablet TAKE 1 TABLET (20 MG TOTAL) BY MOUTH 2 (TWO) TIMES DAILY BEFORE A MEAL. TAKE 1 PILL TWICE A DAY FOR 2 WEEKS, THEN 1 PILL EVERY MORNING. 180 tablet 0   rosuvastatin  (CRESTOR ) 20 MG tablet Take 1 tablet (20 mg total) by mouth daily. 90 tablet 1   Vitamin D , Ergocalciferol , (DRISDOL ) 1.25 MG (50000 UNIT) CAPS capsule TAKE 1 CAPSULE (50,000 UNITS TOTAL) BY MOUTH EVERY 7 (SEVEN) DAYS 12 capsule 0   No current facility-administered medications for this visit.   Known Allergies:  No Known Allergies Past Surgical History: Past Surgical History:  Procedure Laterality Date   TOTAL VAGINAL HYSTERECTOMY  2019   Family History: Family History  Problem Relation Age of Onset   Hypertension Mother    Hyperlipidemia Father    Hypertension Father  Lung disease Father    Snoring Father    Colon cancer Neg Hx    Stomach cancer Neg Hx    Esophageal cancer Neg Hx    Social History: Sherri Wolf lives in a family home built in 2025.  No water damage.  Wood floors throughout.  Electric heating central cooling.  No roaches in the house and bed is 2 feet off floor.  No dust mite precautions.  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   ROS:  All other systems negative except as noted per HPI.  Objective:  Blood pressure 116/68, pulse 91, temperature 97.9 F (36.6 C), temperature source Oral, resp. rate 20, height 5' 3 (1.6 m), weight 184 lb (83.5 kg), SpO2 96%. Body mass index is 32.59 kg/m. Physical Exam:  General Appearance:  Alert,  cooperative, no distress, appears stated age  Head:  Normocephalic, without obvious abnormality, atraumatic  Eyes:  Conjunctiva clear, EOM's intact  Ears EACs normal bilaterally and normal TMs bilaterally  Nose: Nares normal, normal mucosa, no visible anterior polyps, and septum midline  Throat: Lips, tongue normal; teeth and gums normal, normal posterior oropharynx  Neck: Supple, symmetrical  Lungs:   clear to auscultation bilaterally, Respirations unlabored, no coughing  Heart:  regular rate and rhythm and no murmur, Appears well perfused  Extremities:  Bilaterally pitting edema on LE  Skin: Skin color, texture, turgor normal and no rashes or lesions on visualized portions of skin  Neurologic: No gross deficits   Diagnostics: None done    Labs:  Lab Orders  No laboratory test(s) ordered today     Assessment and Plan  Assessment and Plan Assessment & Plan Evaluation for food allergy in the context of abdominal pain, bloating, and constipation Chronic abdominal pain, bloating, and constipation triggered by foods like milk, rice, wheat flour, salad, and fruit. Symptoms include burping and pain lasting up to two days. Previous GI evaluation indicated stool retention. Low FODMAP diet and reflux medications provided limited relief. Discussed cautious interpretation of food allergy testing due to potential non-IgE mediated reactions and risk of unnecessary food avoidance. - Schedule food allergy testing Monday at 10:45 with Chrissie Cheryl  - Discontinue antihistamines, including hydroxyzine , for three days prior to testing. - Eliminate positive  foods one at a time for two weeks if she tests positive and observe for symptom changes.  -If no change in symptoms reintroduce food into diet  - if symptoms improve put that food on the avoid list   Chronic lower extremity edema likely due to venous insufficiency Persistent lower extremity edema likely due to venous insufficiency, exacerbated by  prolonged standing and relieved by leg elevation. Compression stockings previously caused discomfort. Possible valve dysfunction leading to blood pooling. - Discuss venous insufficiency evaluation with primary care provider. - Try different types of compression stockings or bandages. - Increase water intake and use natural diuretics like lemon water, watermelon, and grapefruit.  Chronic pruritus Chronic pruritus improved by reduced salt intake. Itching exacerbated by sugar and salt intake. Hydroxyzine  provides relief for itching but not swelling. - Continue hydroxyzine  for itching as needed. - Reduce salt and sugar intake to manage symptoms.  Follow up: on Monday at 10:45 for skin testing (food panel)       This note in its entirety was forwarded to the Provider who requested this consultation.  Other:    Thank you for your kind referral. I appreciate the opportunity to take part in Cartha's care. Please do not hesitate to contact  me with questions.  Sincerely,  Thank you so much for letting me partake in your care today.  Don't hesitate to reach out if you have any additional concerns!  Hargis Springer, MD  [facility] [place], [place]

## 2024-05-04 NOTE — Patient Instructions (Signed)
 Evaluation for food allergy  in the context of abdominal pain, bloating, and constipation Chronic abdominal pain, bloating, and constipation triggered by foods like milk, rice, wheat flour, salad, and fruit. Symptoms include burping and pain lasting up to two days. Previous GI evaluation indicated stool retention. Low FODMAP diet and reflux medications provided limited relief. Discussed cautious interpretation of food allergy  testing due to potential non-IgE mediated reactions and risk of unnecessary food avoidance. - Schedule food allergy  testing Monday at 10:45 with Chrissie Cheryl  - Discontinue antihistamines, including hydroxyzine , for three days prior to testing. - Eliminate positive  foods one at a time for two weeks if she tests positive and observe for symptom changes.  -If no change in symptoms reintroduce food into diet  - if symptoms improve put that food on the avoid list   Chronic lower extremity edema likely due to venous insufficiency Persistent lower extremity edema likely due to venous insufficiency, exacerbated by prolonged standing and relieved by leg elevation. Compression stockings previously caused discomfort. Possible valve dysfunction leading to blood pooling. - Discuss venous insufficiency evaluation with primary care provider. - Try different types of compression stockings or bandages. - Increase water intake and use natural diuretics like lemon water, watermelon, and grapefruit.  Chronic pruritus Chronic pruritus improved by reduced salt intake. Itching exacerbated by sugar and salt intake. Hydroxyzine  provides relief for itching but not swelling. - Continue hydroxyzine  for itching as needed. - Reduce salt and sugar intake to manage symptoms.  Follow up: on Monday at 10:45 for skin testing (food panel)   It was wonderful to see you! Cooper and Maddie say Hello :)   Thank you so much for letting me partake in your care today.  Don't hesitate to reach out if you have any  additional concerns!  Hargis Springer, MD  [facility] [place], [place]

## 2024-05-08 NOTE — Patient Instructions (Signed)
 Evaluation for food allergy  in the context of abdominal pain, bloating, and constipation Chronic abdominal pain, bloating, and constipation triggered by foods like milk, rice, wheat flour, salad, and fruit. Symptoms include burping and pain lasting up to two days. Previous GI evaluation indicated stool retention. Low FODMAP diet and reflux medications provided limited relief. Discussed cautious interpretation of food allergy  testing due to potential non-IgE mediated reactions and risk of unnecessary food avoidance. - Skin testing today is -Copy of skin test given. - Eliminate positive  foods one at a time for two weeks if she tests positive and observe for symptom changes.  -If no change in symptoms reintroduce food into diet  - if symptoms improve put that food on the avoid list   Chronic lower extremity edema likely due to venous insufficiency Persistent lower extremity edema likely due to venous insufficiency, exacerbated by prolonged standing and relieved by leg elevation. Compression stockings previously caused discomfort. Possible valve dysfunction leading to blood pooling. - Discuss venous insufficiency evaluation with primary care provider. - Try different types of compression stockings or bandages. - Increase water intake and use natural diuretics like lemon water, watermelon, and grapefruit.  Chronic pruritus Chronic pruritus improved by reduced salt intake. Itching exacerbated by sugar and salt intake. Hydroxyzine  provides relief for itching but not swelling. - Continue hydroxyzine  for itching as needed. - Reduce salt and sugar intake to manage symptoms.  Follow up: on   Thank you so much for letting me partake in your care today.  Don't hesitate to reach out if you have any additional concerns!  Hargis Springer, MD  [facility] [place], [place]

## 2024-05-09 ENCOUNTER — Ambulatory Visit (INDEPENDENT_AMBULATORY_CARE_PROVIDER_SITE_OTHER): Admitting: Family

## 2024-05-09 ENCOUNTER — Encounter: Payer: Self-pay | Admitting: Family

## 2024-05-09 DIAGNOSIS — R140 Abdominal distension (gaseous): Secondary | ICD-10-CM | POA: Diagnosis not present

## 2024-05-09 DIAGNOSIS — L299 Pruritus, unspecified: Secondary | ICD-10-CM

## 2024-05-09 DIAGNOSIS — T781XXD Other adverse food reactions, not elsewhere classified, subsequent encounter: Secondary | ICD-10-CM | POA: Diagnosis not present

## 2024-05-09 NOTE — Progress Notes (Signed)
 Date of Service/Encounter:  05/09/24  Allergy  testing appointment   Initial visit on 05/04/24, seen for evaluation for food allergy  in the context of abdominal pain, bloating and constipation, chronic lower extremity edema likely due to venous insufficiency, and chronic pruritus.  Please see that note for additional details.  She reports itching that occurs everywhere with rash for many years.  She reports that she has at least been on hydroxyzine  for 7 to 8 years due to the itching/rash.  She does not have a rash today.  She reports that the rash moves around and does not leave any residual bruising.  She denies any fever, chills, new medications, or products.  When asked if anyone else in the household had the rash she reports that her father had it, but he has now passed away.  She reports his skin issue that was a totally different problem.  She does show me scabs on her left ankle where she has scratched so much from being off antihistamines for the past 3 days.  Today reports for allergy  diagnostic testing:    DIAGNOSTICS:  Skin Testing: Food allergy  panel. Adequate positive and negative controls Results discussed with patient/family.  Food Adult Perc - 05/09/24 1100     Time Antigen Placed 1045    Allergen Manufacturer Jestine    Location Back    Number of allergen test 74     Control-buffer 50% Glycerol Negative    Control-Histamine 3+   10x10   1. Peanut Negative    2. Soybean Negative    3. Wheat Negative    4. Sesame Negative    5. Milk, Cow Negative    6. Casein Negative    7. Egg White, Chicken Negative    8. Shellfish Mix Negative    9. Fish Mix Negative    10. Cashew Negative    11. Walnut Food Negative    12. Almond Negative    13. Hazelnut Negative    14. Pecan Food Negative    15. Pistachio Negative    16. [place] Nut Negative    17. Coconut Negative    19. Tuna Negative    20. Salmon Negative    21. Flounder Negative    22. Codfish Negative    23. Shrimp  Negative    24. Crab Negative    25. Lobster Negative    26. Oyster Negative    27. Scallops Negative    28. Oat  Negative    29. Rice Negative    30. Barley Negative    31. Rye  --   6x5   32. Hops Negative    33. [place] Meat Negative    34. Chicken Meat Negative    35. Pork Negative    36. Beef Negative    37. Lamb Negative    38. Tomato Negative    39. White Potato Negative    40. Sweet Potato Negative    41. Pea, Green/English Negative    42. Navy Bean Negative    43. Green Beans Negative    44. Squash Negative    45. Green Pepper Negative    46. Mushrooms Negative    47. Onion Negative    48. Avocado Negative    49. Cabbage Negative    50. Carrots Negative    51. Celery Negative    52. Corn Negative    53. Cucumber Negative    54. Grape (White seedless) Negative    55. Orange  Negative  56. Lemon Negative    57. Banana Negative    58. Apple Negative    59. Peach Negative    60. Strawberry Negative    61. Blueberry Negative    62. Cherry Negative    63. Cantaloupe Negative    64. Watermelon Negative    65. Pineapple Negative    66. Chocolate/Cacao Bean Negative    67. Cinnamon Negative    68. Nutmeg Negative    69. Ginger Negative    70. Garlic Negative    71. Pepper, Black Negative    72. Mustard Negative          Food Adult Perc - 05/09/24 1100     Time Antigen Placed 1045    Allergen Manufacturer Jestine    Location Back    Number of allergen test 74     Control-buffer 50% Glycerol Negative    Control-Histamine 3+   10x10   1. Peanut Negative    2. Soybean Negative    3. Wheat Negative    4. Sesame Negative    5. Milk, Cow Negative    6. Casein Negative    7. Egg White, Chicken Negative    8. Shellfish Mix Negative    9. Fish Mix Negative    10. Cashew Negative    11. Walnut Food Negative    12. Almond Negative    13. Hazelnut Negative    14. Pecan Food Negative    15. Pistachio Negative    16. [place] Nut Negative    17. Coconut  Negative    19. Tuna Negative    20. Salmon Negative    21. Flounder Negative    22. Codfish Negative    23. Shrimp Negative    24. Crab Negative    25. Lobster Negative    26. Oyster Negative    27. Scallops Negative    28. Oat  Negative    29. Rice Negative    30. Barley Negative    31. Rye  --   6x5   32. Hops Negative    33. [place] Meat Negative    34. Chicken Meat Negative    35. Pork Negative    36. Beef Negative    37. Lamb Negative    38. Tomato Negative    39. White Potato Negative    40. Sweet Potato Negative    41. Pea, Green/English Negative    42. Navy Bean Negative    43. Green Beans Negative    44. Squash Negative    45. Green Pepper Negative    46. Mushrooms Negative    47. Onion Negative    48. Avocado Negative    49. Cabbage Negative    50. Carrots Negative    51. Celery Negative    52. Corn Negative    53. Cucumber Negative    54. Grape (White seedless) Negative    55. Orange  Negative    56. Lemon Negative    57. Banana Negative    58. Apple Negative    59. Peach Negative    60. Strawberry Negative    61. Blueberry Negative    62. Cherry Negative    63. Cantaloupe Negative    64. Watermelon Negative    65. Pineapple Negative    66. Chocolate/Cacao Bean Negative    67. Cinnamon Negative    68. Nutmeg Negative    69. Ginger Negative    70. Garlic Negative  71. Pepper, Black Negative    72. Mustard Negative           Allergy  testing results were read and interpreted by myself, documented by clinical staff.  Patient provided with copy of allergy  testing along with avoidance measures when indicated.   Evaluation for food allergy  in the context of abdominal pain, bloating, and constipation  Past history:Chronic abdominal pain, bloating, and constipation triggered by foods like milk, rice, wheat flour, salad, and fruit. Symptoms include burping and pain lasting up to two days. Previous GI evaluation indicated stool retention. Low FODMAP  diet and reflux medications provided limited relief. Discussed cautious interpretation of food allergy  testing due to potential non-IgE mediated reactions and risk of unnecessary food avoidance. - Skin testing today is positive to rye,but negative to all other foods -Copy of skin test given. - Eliminate positive  foods one at a time for two weeks if she tests positive and observe for symptom changes.  -If no change in symptoms reintroduce food into diet  - if symptoms improve put that food on the avoid list   Chronic lower extremity edema likely due to venous insufficiency Persistent lower extremity edema likely due to venous insufficiency, exacerbated by prolonged standing and relieved by leg elevation. Compression stockings previously caused discomfort. Possible valve dysfunction leading to blood pooling. - Discuss venous insufficiency evaluation with primary care provider. - Try different types of compression stockings or bandages. - Increase water intake and use natural diuretics like lemon water, watermelon, and grapefruit.  Chronic pruritus with rash Chronic pruritus improved by reduced salt intake. Itching exacerbated by sugar and salt intake. Hydroxyzine  provides relief for itching but not swelling. - Continue hydroxyzine  for itching as needed. - Reduce salt and sugar intake to manage symptoms. -take photo of rash when occurs  Follow up: 1-2 weeks or sooner if needed   Thank you so much for letting me partake in your care today.  Don't hesitate to reach out if you have any additional concerns!  Wanda Craze, FNP [facility] [place] 

## 2024-05-10 ENCOUNTER — Other Ambulatory Visit: Payer: Self-pay | Admitting: Medical Genetics

## 2024-05-11 ENCOUNTER — Ambulatory Visit: Admitting: Physician Assistant

## 2024-05-17 ENCOUNTER — Ambulatory Visit: Payer: Medicaid Other | Admitting: Family Medicine

## 2024-05-23 DIAGNOSIS — G4733 Obstructive sleep apnea (adult) (pediatric): Secondary | ICD-10-CM | POA: Diagnosis not present

## 2024-05-27 ENCOUNTER — Ambulatory Visit: Admitting: Family Medicine

## 2024-05-29 NOTE — Progress Notes (Signed)
 Left w/o being seen.  N/c

## 2024-05-30 ENCOUNTER — Ambulatory Visit: Admitting: Internal Medicine

## 2024-06-06 ENCOUNTER — Ambulatory Visit (INDEPENDENT_AMBULATORY_CARE_PROVIDER_SITE_OTHER): Admitting: Internal Medicine

## 2024-06-06 ENCOUNTER — Encounter: Payer: Self-pay | Admitting: Internal Medicine

## 2024-06-06 VITALS — BP 120/78 | HR 80 | Resp 20 | Wt 187.2 lb

## 2024-06-06 DIAGNOSIS — L503 Dermatographic urticaria: Secondary | ICD-10-CM | POA: Diagnosis not present

## 2024-06-06 DIAGNOSIS — R140 Abdominal distension (gaseous): Secondary | ICD-10-CM | POA: Diagnosis not present

## 2024-06-06 DIAGNOSIS — Z780 Asymptomatic menopausal state: Secondary | ICD-10-CM

## 2024-06-06 DIAGNOSIS — I872 Venous insufficiency (chronic) (peripheral): Secondary | ICD-10-CM

## 2024-06-06 DIAGNOSIS — R5383 Other fatigue: Secondary | ICD-10-CM | POA: Diagnosis not present

## 2024-06-06 MED ORDER — FAMOTIDINE 20 MG PO TABS: 20.0000 mg | ORAL_TABLET | Freq: Two times a day (BID) | ORAL | 1 refills | Status: DC

## 2024-06-06 MED ORDER — CETIRIZINE HCL 10 MG PO TABS: 10.0000 mg | ORAL_TABLET | Freq: Every day | ORAL | 1 refills | Status: DC

## 2024-06-06 NOTE — Patient Instructions (Addendum)
 Evaluation for food allergy  in the context of abdominal pain, bloating, and constipation - Skin testing (05/09/24): positive to rye,but negative to all other foods - Eliminate positive  foods one at a time for two weeks if she tests positive and observe for symptom changes.  -If no change in symptoms reintroduce food into diet  - if symptoms improve put that food on the avoid list  - Do clean out as prescribed by GI  - Follow up with GI  - discuss menopause treatment as this can be a cause of chronic GI issues   Chronic lower extremity edema likely due to venous insufficiency. - Discuss venous insufficiency evaluation with primary care provider. - Try different types of compression stockings or bandages. - Increase water intake and use natural diuretics like lemon water, watermelon, and grapefruit.  Chronic pruritus with rash - Will get chronic hive labs  - .Start zyrtec  (cetirizine ) 10mg  daily, can increase to twice daily as needed  - Start Pepcid  20mg  daily, can increase to twice daily as needed   -this replaces your omeprazole  and other reflux medications  - Reduce salt and sugar intake to manage symptoms.   Follow up: 3 months    Thank you so much for letting me partake in your care today.  Don't hesitate to reach out if you have any additional concerns!

## 2024-06-06 NOTE — Progress Notes (Signed)
 FOLLOW UP Date of Service/Encounter:  06/06/24  Subjective:  Sherri Wolf (DOB: 1966-02-08) is a 58 y.o. female who [facility]24 in re-evaluation of the following: bloating, pruritus,  History obtained from: chart review and patient.  For Review, LV was on 05/04/24  with Wanda Craze, FNP seen for skin testing . See below for summary of history and diagnostics.   Therapeutic plans/changes recommended: elimination diet  ----------------------------------------------------- Pertinent History/Diagnostics:  Food Intolerance:  Chronic abdominal pain, bloating, and constipation triggered by foods like milk, rice, wheat flour, salad, and fruit. Symptoms include burping and pain lasting up to two days.  - SPT select foods (05/09/24): positive to rye, negative to all other foods  Chronic Pruritus  Chronic pruritus improved by reduced salt intake. Itching exacerbated by sugar and salt intake. Hydroxyzine  provides relief for itching but not swelling. Other: Persistent lower extremity edema likely due to venous insufficiency, exacerbated by prolonged standing and relieved by leg elevation. Compression stockings previously caused discomfort --------------------------------------------------- Today presents for follow-up. Discussed the use of AI scribe software for clinical note transcription with the patient, who gave verbal consent to proceed.  History of Present Illness Sherri Wolf is a 58 year old female who presents with persistent bloating and itching. She was referred by Dr. Delon Sands for evaluation of her gastrointestinal issues.  Gastrointestinal symptoms - Persistent bloating beginning in the morning, accompanied by excessive gas and a burning sensation in the stomach - Dietary modifications, including an elimination diet and consuming warm milk, provide only temporary relief - Omeprazole  taken as needed, but does not fully alleviate symptoms -  Constipation managed effectively with flaxseed oil in warm water, but persistent sensation of incomplete bowel evacuation after meals - Miralax intolerable due to nausea - Avoids preservatives and outside juices, prefers fresh options - Uses prunes to aid digestion - No nausea or vomiting  Pruritus and angioedema - Chronic itching and swelling, with itching preceding swelling - Symptoms occur intermittently, with relief lasting five to six days before recurrence - Hydroxyzine  taken as needed, but causes drowsiness, so only used when symptoms are severe - pictures consistent with dermatographism    Fatigue and myalgias - Persistent fatigue and lack of energy upon waking despite adequate sleep - History of fibromyalgia - Pain and stiffness in the legs, especially after prolonged sitting - Difficulty walking due to pain  Sleep disturbance - History of sleep apnea - Uses CPAP machine  Suspected vitamin d  deficiency - Suspects low vitamin D  levels     All medications reviewed by clinical staff and updated in chart. No new pertinent medical or surgical history except as noted in HPI.  ROS: All others negative except as noted per HPI.   Objective:  BP 120/78 (BP Location: Right Arm, Patient Position: Sitting)   Pulse 80   Resp 20   Wt 187 lb 3.2 oz (84.9 kg)   SpO2 100%   BMI 33.16 kg/m  Body mass index is 33.16 kg/m. Physical Exam: General Appearance:  Alert, cooperative, no distress, appears stated age  Head:  Normocephalic, without obvious abnormality, atraumatic  Eyes:  Conjunctiva clear, EOM's intact  Ears EACs normal bilaterally  Nose: Nares normal, no rhinorrhea   Throat: Lips, tongue normal; teeth and gums normal, MMM  Neck: Supple, symmetrical  Lungs:    , Respirations unlabored, no coughing  Heart:   , Appears well perfused  Extremities: No edema  Skin: Skin color, texture, turgor normal and  no rashes or lesions on visualized portions of skin  Neurologic: No  gross deficits   Labs:  Lab Orders         Chronic Urticaria (CU) Eval         IgE         Tryptase       Assessment/Plan   Patient Instructions  Evaluation for food allergy  in the context of abdominal pain, bloating, and constipation - Skin testing (05/09/24): positive to rye,but negative to all other foods - Eliminate positive  foods one at a time for two weeks if she tests positive and observe for symptom changes.  -If no change in symptoms reintroduce food into diet  - if symptoms improve put that food on the avoid list  - Do clean out as prescribed by GI  - Follow up with GI  - discuss menopause treatment as this can be a cause of chronic GI issues   Chronic lower extremity edema likely due to venous insufficiency. - Discuss venous insufficiency evaluation with primary care provider. - Try different types of compression stockings or bandages. - Increase water intake and use natural diuretics like lemon water, watermelon, and grapefruit.  Chronic pruritus with rash - Will get chronic hive labs  - .Start zyrtec  (cetirizine ) 10mg  daily, can increase to twice daily as needed  - Start Pepcid  20mg  daily, can increase to twice daily as needed   -this replaces your omeprazole  and other reflux medications  - Reduce salt and sugar intake to manage symptoms.   Follow up: 3 months    Thank you so much for letting me partake in your care today.  Don't hesitate to reach out if you have any additional concerns!  Other:    Thank you so much for letting me partake in your care today.  Don't hesitate to reach out if you have any additional concerns!  Hargis Springer, MD  [facility]- [place], [place]

## 2024-06-07 ENCOUNTER — Ambulatory Visit: Admitting: Family Medicine

## 2024-06-10 DIAGNOSIS — Z136 Encounter for screening for cardiovascular disorders: Secondary | ICD-10-CM | POA: Diagnosis not present

## 2024-06-10 DIAGNOSIS — Z13220 Encounter for screening for lipoid disorders: Secondary | ICD-10-CM | POA: Diagnosis not present

## 2024-06-10 DIAGNOSIS — M542 Cervicalgia: Secondary | ICD-10-CM | POA: Diagnosis not present

## 2024-06-10 DIAGNOSIS — Z130 Encounter for screening for diseases of the blood and blood-forming organs and certain disorders involving the immune mechanism: Secondary | ICD-10-CM | POA: Diagnosis not present

## 2024-06-10 DIAGNOSIS — Z131 Encounter for screening for diabetes mellitus: Secondary | ICD-10-CM | POA: Diagnosis not present

## 2024-06-10 DIAGNOSIS — M79673 Pain in unspecified foot: Secondary | ICD-10-CM | POA: Diagnosis not present

## 2024-06-10 DIAGNOSIS — M79672 Pain in left foot: Secondary | ICD-10-CM | POA: Diagnosis not present

## 2024-06-10 DIAGNOSIS — Z1329 Encounter for screening for other suspected endocrine disorder: Secondary | ICD-10-CM | POA: Diagnosis not present

## 2024-06-10 DIAGNOSIS — E559 Vitamin D deficiency, unspecified: Secondary | ICD-10-CM | POA: Diagnosis not present

## 2024-06-14 LAB — CHRONIC URTICARIA (CU) EVAL
ALT: 25 IU/L (ref 0–32)
Basophils Absolute: 0.1 x10E3/uL (ref 0.0–0.2)
Basos: 1 %
CRP: 6 mg/L (ref 0–10)
EOS (ABSOLUTE): 0.2 x10E3/uL (ref 0.0–0.4)
Eos: 3 %
Hematocrit: 35.8 % (ref 34.0–46.6)
Hemoglobin: 11.7 g/dL (ref 11.1–15.9)
Immature Grans (Abs): 0 x10E3/uL (ref 0.0–0.1)
Immature Granulocytes: 0 %
Lymphocytes Absolute: 2 x10E3/uL (ref 0.7–3.1)
Lymphs: 28 %
MCH: 27.8 pg (ref 26.6–33.0)
MCHC: 32.7 g/dL (ref 31.5–35.7)
MCV: 85 fL (ref 79–97)
Monocytes Absolute: 0.5 x10E3/uL (ref 0.1–0.9)
Monocytes: 8 %
Neutrophils Absolute: 4.1 x10E3/uL (ref 1.4–7.0)
Neutrophils: 60 %
Platelets: 352 x10E3/uL (ref 150–450)
Pooled Donor- BAT CU: 4.4 % (ref 0.00–10.60)
RBC: 4.21 x10E6/uL (ref 3.77–5.28)
RDW: 13.2 % (ref 11.7–15.4)
Sed Rate: 34 mm/h (ref 0–40)
TSH: 2.16 u[IU]/mL (ref 0.450–4.500)
Thyroperoxidase Ab SerPl-aCnc: 20 [IU]/mL (ref 0–34)
WBC: 7 x10E3/uL (ref 3.4–10.8)

## 2024-06-14 LAB — IGE: IgE (Immunoglobulin E), Serum: 50 [IU]/mL (ref 6–495)

## 2024-06-14 LAB — TRYPTASE: Tryptase: 12.5 ug/L (ref 2.2–13.2)

## 2024-06-15 ENCOUNTER — Ambulatory Visit: Admitting: Neurology

## 2024-06-21 DIAGNOSIS — J069 Acute upper respiratory infection, unspecified: Secondary | ICD-10-CM | POA: Diagnosis not present

## 2024-06-23 ENCOUNTER — Ambulatory Visit: Payer: Self-pay | Admitting: Internal Medicine

## 2024-06-23 MED ORDER — CETIRIZINE HCL 10 MG PO TABS: 10.0000 mg | ORAL_TABLET | Freq: Two times a day (BID) | ORAL | 1 refills | Status: DC

## 2024-06-23 NOTE — Progress Notes (Signed)
 Hive labs returned mostly normal except for tryptase which can be a marker of mast cell disorders.  At its current level at 12.5, recommend a trial of high dose anti histamines to see if itch, swelling and possible GI symptoms improve.  Would try zyrtec  (cetirizine ) 10mg  twice daily, if symptoms improve can increase up to 4 times a day. Can someone let patient know?

## 2024-06-23 NOTE — Telephone Encounter (Signed)
 Patient advised of labs and will increase 10 mg zyrtec  from daily to twice daily new Rx sent to pharmacy and patient is aware.

## 2024-06-28 ENCOUNTER — Ambulatory Visit: Admitting: Physician Assistant

## 2024-06-30 ENCOUNTER — Other Ambulatory Visit (HOSPITAL_COMMUNITY)

## 2024-07-21 ENCOUNTER — Other Ambulatory Visit: Payer: Self-pay | Admitting: Medical Genetics

## 2024-07-21 ENCOUNTER — Ambulatory Visit: Payer: Self-pay

## 2024-07-21 DIAGNOSIS — Z006 Encounter for examination for normal comparison and control in clinical research program: Secondary | ICD-10-CM

## 2024-07-21 NOTE — Telephone Encounter (Signed)
 FYI Only or Action Required?: FYI only for provider: appointment scheduled on 07/22/24.  Patient was last seen in primary care on 11/16/2023 by Wendolyn Jenkins Jansky, MD.  Called Nurse Triage reporting Foot Pain.  Symptoms began several weeks ago. Arm pain and swelling has started more recently  Interventions attempted: OTC medications: advil, not helping.  Symptoms are: gradually worsening.  Triage Disposition: See HCP Within 4 Hours (Or PCP Triage)  Patient/caregiver understands and will follow disposition?: Yes, will follow disposition  Copied from CRM #[id number]. Topic: Clinical - Red Word Triage >> Jul 21, 2024 11:09 AM Suzen RAMAN wrote: Red Word that prompted transfer to Nurse Triage: left foot and hand pain, request an appt with Dr. Henreitta visit 10/2023); patient also request Physical Reason for Disposition  [1] SEVERE pain (e.g., excruciating, unable to do any normal activities) AND [2] not improved after 2 hours of pain medicine  Answer Assessment - Initial Assessment Questions 1. ONSET: When did the pain start?      Few days 2. LOCATION: Where is the pain located?      L foot and L hand 3. PAIN: How bad is the pain?    (Scale 1-10; or mild, moderate, severe)     9 4. WORK OR EXERCISE: Has there been any recent work or exercise that involved this part of the body?      Denies injury 5. CAUSE: What do you think is causing the foot pain?     Unsure about foot or hand pain, pt denies injury.  6. OTHER SYMPTOMS: Do you have any other symptoms? (e.g., leg pain, rash, fever, numbness)     Numbness in hand at night  Pt also requested a 6 mo f/u appt be scheduled for prior visit with PCP.  Protocols used: Foot Pain-A-AH

## 2024-07-21 NOTE — Telephone Encounter (Signed)
 Please advise on what treatment plan should be until patient can be seen in office as over the counter medications are not working.  Thank you!

## 2024-07-22 ENCOUNTER — Encounter: Payer: Self-pay | Admitting: Family Medicine

## 2024-07-22 ENCOUNTER — Ambulatory Visit (INDEPENDENT_AMBULATORY_CARE_PROVIDER_SITE_OTHER): Admitting: Family Medicine

## 2024-07-22 VITALS — BP 138/86 | HR 81 | Temp 97.9°F | Ht 63.0 in | Wt 186.8 lb

## 2024-07-22 DIAGNOSIS — M722 Plantar fascial fibromatosis: Secondary | ICD-10-CM

## 2024-07-22 DIAGNOSIS — G5603 Carpal tunnel syndrome, bilateral upper limbs: Secondary | ICD-10-CM

## 2024-07-22 DIAGNOSIS — M7582 Other shoulder lesions, left shoulder: Secondary | ICD-10-CM

## 2024-07-22 DIAGNOSIS — Z23 Encounter for immunization: Secondary | ICD-10-CM

## 2024-07-22 MED ORDER — DICLOFENAC SODIUM 75 MG PO TBEC: 75.0000 mg | DELAYED_RELEASE_TABLET | Freq: Two times a day (BID) | ORAL | 0 refills | Status: DC

## 2024-07-22 NOTE — Patient Instructions (Signed)
 Please follow up as scheduled for your next visit with Dr. Wendolyn  If you have any questions or concerns, please don't hesitate to send me a message via MyChart or call the office at [phone]. Thank you for visiting with us  today! It's our pleasure caring for you.   Plantar Fasciitis: What to Know  Your plantar fascia is a band of thick tissue on the bottom of your foot. It connects your heel bone to the base of your toes. If the fascia gets irritated, it can cause pain in your heel or foot. This is called plantar fasciitis. In some cases, plantar fasciitis can make it hard for you to walk or move. The pain is often worse in the morning after sleeping, or after sitting or lying down for a long time. Pain may also be worse after walking or standing for a long time. What are the causes? Plantar fasciitis may be caused by: Standing for a long time. Wearing shoes that don't have good arch support. Doing high-impact activities. These are things that put stress on your joints. They include: Ballet. Aerobic exercises. These are exercises that make your heart beat faster. Being overweight. Having a way of walking, or gait, that isn't normal. Tight muscles in your calf, which is in the back of your lower leg. High arches in your feet, or flat feet. Starting a new sport or activity. What are the signs or symptoms? The main symptom of plantar fasciitis is heel pain. Your pain may get worse after: You take your first steps after a time of rest. This includes in the morning after you wake up, or after you've been sitting or lying down for a while. Standing still for a long time. Pain may lessen after 30-45 minutes of activity, such as gentle walking. How is this diagnosed? Plantar fasciitis may be diagnosed based on your medical history, your symptoms, and an exam. Your health care provider will check for: A tender spot on the bottom of your foot. A high arch in your foot, or flat feet. Pain when  you move your foot. Trouble moving your foot. You may also have tests. These may include: X-rays. Ultrasound. MRI. How is this treated? Treatment depends on how bad your plantar fasciitis is. It may include: RICE therapy. This stands for rest, ice, pressure (compression), and raising (elevating) the foot. Exercises to stretch your calves and plantar fascia. A night splint. This holds your foot in a stretched, upward position while you sleep. Physical therapy. This can help with symptoms. It can also prevent problems in the future. Shots of a steroid medicine called cortisone. This can help with pain and irritation. Extracorporeal shock wave therapy. This uses electric shocks to stimulate your plantar fascia. If other treatments don't help, you may need to have surgery. Follow these instructions at home: Managing pain, stiffness, and swelling  Use ice, an ice pack, or a frozen bottle of water as told. Place a towel between your skin and the ice. Roll the bottom of your foot over the ice or frozen bottle. Do this for 20 minutes, 2-3 times a day. If your skin turns red, take off the ice right away to prevent skin damage. The risk of damage is higher if you can't feel pain, heat, or cold. Wear shoes that have air-sole or gel-sole cushions. You could also try soft shoe inserts made for plantar fasciitis. Activity Try not to do things that cause pain. Ask what things are safe for you to do.  Exercise as told. Try activities that are low impact. This means that they're easier on your joints. They include: Swimming. Water aerobics. Biking. General instructions Take your medicines only as told. Wear a night splint as told. Loosen the splint if your toes tingle, are numb, or turn cold and blue. Stay at a healthy weight. Work with your provider to lose weight as needed. Contact a health care provider if: Your symptoms don't go away with treatment. You have pain that gets worse. Your pain  makes it hard to move or do everyday things. This information is not intended to replace advice given to you by your health care provider. Make sure you discuss any questions you have with your health care provider. Document Revised: 02/09/2023 Document Reviewed: 02/09/2023 Elsevier Patient Education  2024 [employer]  Pinched Nerve in the Wrist (Carpal Tunnel Syndrome): What to Know  Pinched nerve in the wrist (carpal tunnel syndrome, or CTS) is a nerve problem that causes pain, numbness, and weakness in the wrist, hand, and fingers. The carpal tunnel is a narrow space that is on the palm side of your wrist. Repeated wrist motions or certain diseases may cause swelling in the tunnel. This swelling can pinch the main nerve in the wrist (the median nerve). What are the causes? CTS may be caused by: Moving your hand and wrist over and over again while doing a task. Hurting the wrist. Arthritis. A pocket of fluid (cyst) or a growth (tumor) in the carpal tunnel. Fluid buildup when you are pregnant. Use of tools that vibrate. In some cases, the cause of CTS is not known. What increases the risk? You're more likely to have CTS if: You have a job that makes you do these things: Move your hand firmly over and over again. Work with tools that vibrate, such as drills or sanders. You're female. You have diabetes, obesity, thyroid problems, or kidney failure. What are the signs or symptoms? Symptoms of this condition include: A tingling feeling in your fingers. You may feel this pain in the thumb, index finger, or middle finger. Tingling or loss of feeling in your hand. Pain in your entire arm. This pain may get worse when you bend your wrist and elbow for a long time. Pain in your wrist that goes up your arm to your shoulder. Pain that goes down into your palm or fingers. Weakness in your hands. You may find it hard to grab and hold items. Your symptoms may feel worse during the night. How  is this diagnosed? CTS is diagnosed with a medical history and physical exam. Tests and imaging may also be done to: Check the electrical signals sent by your nerves into the muscles. Check how well electrical signals pass through your nerves. Check possible causes of your CTS. These include X-rays, ultrasound, and MRI. How is this treated? CTS may be treated with: Lifestyle changes. You will be asked to stop or change the activity that caused your problem. Physical therapy. This may include: Exercises that stretch and strengthen the muscles and tendons in the wrist and hand. Nerve gliding or flossing exercises. These help keep nerves moving smoothly through the tissues around them. Occupational therapy. You'll learn how to use your hand again. Medicines for pain and swelling. You may have injections in your wrist. A wrist splint or brace. Surgery. Follow these instructions at home: If you have a splint or brace: Wear the splint or brace as told. Take it off only if your provider says  you can. Check the skin around it every day. Tell your provider if you see problems. Loosen the splint or brace if your fingers tingle, are numb, or turn cold and blue. Keep the splint or brace clean and dry. If the splint or brace isn't waterproof: Do not let it get wet. Cover it when you take a bath or shower. Use a cover that doesn't let any water in. Managing pain, stiffness, and swelling  Use ice or an ice pack as told. If you have a splint or brace that you can take off, remove it only as told. Place a towel between your skin and the ice. Leave the ice on for 20 minutes, 2-3 times a day. If your skin turns red, take off the ice right away to prevent skin damage. The risk of damage is higher if you can't feel pain, heat, or cold. Move your fingers often to reduce stiffness and swelling. General instructions Take your medicines only as told. Rest your wrist and hand from activity that may cause  pain. If your CTS is caused by things you do at work, talk with your employer about making changes. For example, you may need a wrist pad to use while typing. Exercise as told. Follow instructions on how to do nerve gliding or flossing exercises. These help keep nerves in moving smoothly through the tissues around them. Keep all follow-up visits. This is important. Where to find more information American Academy of Orthopedic Surgeons: orthoinfo.aaos.[employer] of Neurological Disorders and Stroke: basicfm.no Contact a health care provider if: You have new symptoms. Your pain is not controlled with medicines. Your symptoms get worse. Get help right away if: Your hand or wrist tingles or is numb, and the symptoms become very bad. This information is not intended to replace advice given to you by your health care provider. Make sure you discuss any questions you have with your health care provider. Document Revised: 07/21/2023 Document Reviewed: 05/08/2023 Elsevier Patient Education  2024 Else[employer] Inc.  Rotator Cuff Tendinitis  Rotator cuff tendinitis is inflammation of the tendons in the rotator cuff. Tendons are tough, cord-like bands that connect muscle to bone. The rotator cuff includes all of the muscles and tendons that connect the arm to the shoulder. The rotator cuff holds the head of the humerus, or the upper arm bone, in the cup of the shoulder blade (scapula). This condition can lead to a long-term (chronic) tear. The tear may be partial or complete. What are the causes? This condition is usually caused by overusing the rotator cuff. What increases the risk? This condition is more likely to develop in athletes and workers who frequently use their shoulder or reach over their heads. This can include activities such as: Tennis. Baseball or softball. Swimming. Construction work. Painting. What are the signs or symptoms? Symptoms of this condition include: Pain  that spreads (radiates) from the shoulder to the upper arm. Swelling and tenderness in front of the shoulder. Pain when reaching, pulling, or lifting the arm above the head. Pain when lowering the arm from above the head. Minor pain in the shoulder when resting. Increased pain in the shoulder at night. Difficulty placing the arm behind the back. How is this diagnosed? This condition is diagnosed with a physical exam and medical history. Tests may also be done, including: X-rays. CT. MRI. Ultrasound. How is this treated? Treatment depends on the severity of the condition. In less severe cases, treatment may include: Rest. This may be done  with a sling that holds the shoulder still (immobilization). Your health care provider may also recommend avoiding activities that involve lifting your arm over your head. Icing the shoulder. Anti-inflammatory medicines, such as aspirin or ibuprofen. In more severe cases, treatment may include: Physical therapy. Steroid injections. Surgery. Follow these instructions at home: If you have a removable sling: Wear the sling as told by your provider. Remove it only as told by your provider. Check the skin around the sling every day. Tell your provider about any concerns. Loosen the sling if your fingers tingle, become numb, or turn cold or blue. Keep the sling clean and dry. If the sling is not waterproof: Do not let it get wet. Remove it as told by your provider when you take a bath or shower. Managing pain, stiffness, and swelling  If told, put ice on the injured area. If you have a removable sling, remove it as told by your provider. Put ice in a plastic bag. Place a towel between your skin and the bag. Leave the ice on for 20 minutes, 2-3 times a day. If your skin turns bright red, remove the ice right away to prevent skin damage. The risk of damage is higher if you cannot feel pain, heat, or cold. Move your fingers often to reduce stiffness and  swelling. Raise (elevate) the injured area above the level of your heart while you are sitting or lying down. Find a comfortable sleeping position, or sleep in a recliner, if available. Activity Rest your shoulder as told by your provider. Ask your provider when it is safe to drive if you have a sling on your arm. Return to your normal activities as told by your provider. Ask your provider what activities are safe for you. Do any exercises or stretches as told by your provider or physical therapist. If you do repetitive overhead tasks, take small breaks in between and include stretching exercises as told by your provider. General instructions Do not use any products that contain nicotine or tobacco. These products include cigarettes, chewing tobacco, and vaping devices, such as e-cigarettes. These can delay healing. If you need help quitting, ask your provider. Take over-the-counter and prescription medicines only as told by your provider. Contact a health care provider if: Your pain gets worse. You have new pain in your arm, hands, or fingers. Your pain is not relieved with medicine or does not get better after 6 weeks of treatment. You have crackling sensations when moving your shoulder in certain directions. You hear a snapping sound after using your shoulder, followed by severe pain and weakness. Your arm, hand, or fingers are numb or tingling. Get help right away if: Your arm, hand, or fingers are swollen, painful, or they turn white or blue. This information is not intended to replace advice given to you by your health care provider. Make sure you discuss any questions you have with your health care provider. Document Revised: 05/07/2022 Document Reviewed: 04/23/2022 Elsevier Patient Education  2024 [employer].

## 2024-07-22 NOTE — Progress Notes (Signed)
 Subjective  CC:  Chief Complaint  Patient presents with   Hand Pain   Foot Pain    Pt stated t hat she has been experiencing hand/foot pain for the past 3 weeks and it is getting worse along with lt shoulder pain    HPI: Sherri Wolf is a 58 y.o. female who presents to the office today to address the problems listed above in the chief complaint. Discussed the use of AI scribe software for clinical note transcription with the patient, who gave verbal consent to proceed.  History of Present Illness Sherri Wolf is a 58 year old female with fibromyalgia and carpal tunnel syndrome who presents with foot and shoulder pain.  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 - Unable to sit for extended periods due to job requirements  Shoulder pain - Severe pain in the left shoulder with onset about ten days ago - Interferes with ability to hold or lift objects - Ibuprofen and use of a roller have not provided relief  Hand and wrist symptoms - Numbness and pain in fingers, especially after 3 AM - History of carpal tunnel syndrome diagnosed in [place]  - Requires shaking hands to relieve numbness - Swelling and pain present in both wrists  Fibromyalgia symptoms - History of fibromyalgia for approximately seven years - Characterized by periods of remission and flare-ups - Current period is particularly painful - Swelling in legs and pain in calves, uncertain if related to fibromyalgia  Weight gain and metabolic changes - Vegetarian diet - Weight gain without loss of inches - Post-menopausal status - Concern regarding metabolic changes after menopause  Vitamin d  deficiency - Very low vitamin D  level - Started over-the-counter vitamin D   supplementation   Assessment  1. Rotator cuff tendonitis, left   2. Plantar fasciitis, left   3. Bilateral carpal tunnel syndrome   4. Need for influenza vaccination      Plan  Assessment and Plan Assessment & Plan Plantar fasciitis, right foot Plantar fasciitis in the right foot, characterized by inflammation of the fibrous tissue connecting to the heel bone. Symptoms include significant pain upon waking and difficulty standing on hard surfaces. - Provided handout on plantar fasciitis - Advised icing the foot using a frozen water bottle - Recommended stretching exercises for the heel cord - Suggested using a heel lift in the shoe  Left shoulder rotator cuff strain Left shoulder rotator cuff strain, likely due to overuse and standing for prolonged periods. Symptoms include pain and swelling, exacerbated by certain movements. - Prescribed anti-inflammatory medication for two weeks - Advised icing the shoulder  Bilateral carpal tunnel syndrome Numbness and pain, particularly at night. Symptoms include numbness in fingers after midnight and pain when holding objects. - Recommended wrist splints at night to prevent wrist flexion - Advised icing and massaging the wrists  Fibromyalgia Chronic condition with widespread musculoskeletal pain, exacerbated by stress and possibly weather changes. Symptoms include sensitivity to pain and difficulty with weight management. - Advised stress management techniques - Recommended strength training exercises when feeling better  Vitamin D  deficiency Low vitamin D  levels, which can be easily corrected with supplementation. - Prescribed vitamin D  supplementation  Overweight Status, potentially exacerbated by post-menopausal metabolic changes. Discussed challenges with weight management and potential benefits of GLP-1 medications,  though cost is a barrier. - Advised clean eating and strength training - Discussed potential future use of GLP-1  medications for weight management    Follow up: prn Orders Placed This Encounter  Procedures   Flu vaccine trivalent PF, 6mos and older(Flulaval,Afluria,Fluarix,Fluzone)   Meds ordered this encounter  Medications   diclofenac (VOLTAREN) 75 MG EC tablet    Sig: Take 1 tablet (75 mg total) by mouth 2 (two) times daily.    Dispense:  30 tablet    Refill:  0     I reviewed the patients updated PMH, FH, and SocHx.  Patient Active Problem List   Diagnosis Date Noted   OSA on CPAP 04/25/2024   Non-seasonal allergic rhinitis due to pollen 11/16/2023   Familial hyperlipidemia 11/18/2022   Chest pain of uncertain etiology 11/18/2022   Depression, recurrent 10/21/2022   Hyperlipidemia 12/04/2021   Edema leg 12/04/2021   Fibromyalgia 12/04/2021   Gastroesophageal reflux disease without esophagitis 12/04/2021   Pruritus 12/04/2021   Peripheral edema 01/07/2021   Acute non-recurrent maxillary sinusitis 09/08/2020   COVID-19 ruled out by laboratory testing 09/08/2020   Lower respiratory infection 09/08/2020   Persistent cough 03/02/2020   Vitamin D  deficiency 12/30/2019   Bilateral carpal tunnel syndrome 10/28/2019   Numbness 10/28/2019   Primary osteoarthritis of both first carpometacarpal joints 10/28/2019   Trigger middle finger of left hand 10/28/2019   Neck pain 10/28/2019   Prediabetes 07/01/2019   Pain in right knee 04/30/2018   Colon polyp 10/27/2017   Migraine 10/27/2017   Other specified postprocedural states 10/27/2017   Right ovarian cyst 10/27/2017   Left ovarian cyst 10/27/2017   Essential hypertension 05/28/2017   Hypertension 2017   Excessive weight gain 06/06/2015   Low back pain 06/06/2015   Other chest pain 06/06/2015   Hypercholesteremia 06/06/2015   Current Meds  Medication Sig   azelastine  (ASTELIN ) 0.1 % nasal spray Place 2 sprays into both nostrils 2 (two) times daily.   Bempedoic Acid-Ezetimibe (NEXLIZET ) 180-10 MG TABS Take 1 tablet by mouth daily  in the afternoon.   cetirizine  (ZYRTEC ) 10 MG tablet Take 1 tablet (10 mg total) by mouth in the morning and at bedtime.   cyanocobalamin  (VITAMIN B12) 1000 MCG tablet Take 1 tablet by mouth daily.   diclofenac (VOLTAREN) 75 MG EC tablet Take 1 tablet (75 mg total) by mouth 2 (two) times daily.   famotidine  (PEPCID ) 20 MG tablet Take 1 tablet (20 mg total) by mouth 2 (two) times daily.   Multiple Vitamins-Minerals (ONCOVITE) TABS Take 1 capsule by mouth daily.   OVER THE COUNTER MEDICATION Pt taking vitamin D  1 a day   pantoprazole  (PROTONIX ) 20 MG tablet TAKE 1 TABLET (20 MG TOTAL) BY MOUTH 2 (TWO) TIMES DAILY BEFORE A MEAL. TAKE 1 PILL TWICE A DAY FOR 2 WEEKS, THEN 1 PILL EVERY MORNING.   rosuvastatin  (CRESTOR ) 20 MG tablet Take 1 tablet (20 mg total) by mouth daily.   Vitamin D , Ergocalciferol , (DRISDOL ) 1.25 MG (50000 UNIT) CAPS capsule TAKE 1 CAPSULE (50,000 UNITS TOTAL) BY MOUTH EVERY 7 (SEVEN) DAYS   Allergies: Patient has no known allergies. Family History: Patient family history includes Hyperlipidemia in her father; Hypertension in her father and mother; Lung disease in her father; Snoring in her father. Social History:  Patient  reports that she has never smoked. She has never used smokeless tobacco. She reports that she does not drink alcohol and does not use drugs.  Review of Systems: Constitutional:  Negative for fever malaise or anorexia Cardiovascular: negative for chest pain Respiratory: negative for SOB or persistent cough Gastrointestinal: negative for abdominal pain  Objective  Vitals: BP 138/86   Pulse 81   Temp 97.9 F (36.6 C)   Ht 5' 3 (1.6 m)   Wt 186 lb 12.8 oz (84.7 kg)   SpO2 99%   BMI 33.09 kg/m  General: no acute distress , A&Ox3 Heel: + ttp and pain with plantar flexion. No posterior heel ttp Shoulder: + empty can testing Bilateral wrist: + phalen's bilaterallhy Commons side effects, risks, benefits, and alternatives for medications and treatment  plan prescribed today were discussed, and the patient expressed understanding of the given instructions. Patient is instructed to call or message via MyChart if he/she has any questions or concerns regarding our treatment plan. No barriers to understanding were identified. We discussed Red Flag symptoms and signs in detail. Patient expressed understanding regarding what to do in case of urgent or emergency type symptoms.  Medication list was reconciled, printed and provided to the patient in AVS. Patient instructions and summary information was reviewed with the patient as documented in the AVS. This note was prepared with assistance of Dragon voice recognition software. Occasional wrong-word or sound-a-like substitutions may have occurred due to the inherent limitations of voice recognition software

## 2024-07-29 ENCOUNTER — Ambulatory Visit: Admitting: Family Medicine

## 2024-07-29 VITALS — BP 118/70 | HR 76 | Temp 97.9°F | Ht 63.0 in | Wt 188.2 lb

## 2024-07-29 DIAGNOSIS — E7849 Other hyperlipidemia: Secondary | ICD-10-CM

## 2024-07-29 DIAGNOSIS — R7303 Prediabetes: Secondary | ICD-10-CM | POA: Diagnosis not present

## 2024-07-29 DIAGNOSIS — E559 Vitamin D deficiency, unspecified: Secondary | ICD-10-CM | POA: Diagnosis not present

## 2024-07-29 DIAGNOSIS — M797 Fibromyalgia: Secondary | ICD-10-CM

## 2024-07-29 DIAGNOSIS — R600 Localized edema: Secondary | ICD-10-CM | POA: Diagnosis not present

## 2024-07-29 DIAGNOSIS — M791 Myalgia, unspecified site: Secondary | ICD-10-CM | POA: Diagnosis not present

## 2024-07-29 DIAGNOSIS — G4733 Obstructive sleep apnea (adult) (pediatric): Secondary | ICD-10-CM | POA: Diagnosis not present

## 2024-07-29 DIAGNOSIS — R7989 Other specified abnormal findings of blood chemistry: Secondary | ICD-10-CM | POA: Diagnosis not present

## 2024-07-29 LAB — COMPREHENSIVE METABOLIC PANEL WITH GFR
ALT: 24 U/L (ref 0–35)
AST: 25 U/L (ref 0–37)
Albumin: 4.2 g/dL (ref 3.5–5.2)
Alkaline Phosphatase: 89 U/L (ref 39–117)
BUN: 11 mg/dL (ref 6–23)
CO2: 26 meq/L (ref 19–32)
Calcium: 9.3 mg/dL (ref 8.4–10.5)
Chloride: 104 meq/L (ref 96–112)
Creatinine, Ser: 0.74 mg/dL (ref 0.40–1.20)
GFR: 89.7 mL/min (ref >60.00)
Glucose, Bld: 84 mg/dL (ref 70–99)
Potassium: 4.5 meq/L (ref 3.5–5.1)
Sodium: 139 meq/L (ref 135–145)
Total Bilirubin: 0.4 mg/dL (ref 0.2–1.2)
Total Protein: 7.2 g/dL (ref 6.0–8.3)

## 2024-07-29 LAB — MAGNESIUM: Magnesium: 1.9 mg/dL (ref 1.5–2.5)

## 2024-07-29 LAB — CBC WITH DIFFERENTIAL/PLATELET
Basophils Absolute: 0.1 K/uL (ref 0.0–0.1)
Basophils Relative: 1 % (ref 0.0–3.0)
Eosinophils Absolute: 0.2 K/uL (ref 0.0–0.7)
Eosinophils Relative: 3.7 % (ref 0.0–5.0)
HCT: 36.8 % (ref 36.0–46.0)
Hemoglobin: 12.2 g/dL (ref 12.0–15.0)
Lymphocytes Relative: 34.1 % (ref 12.0–46.0)
Lymphs Abs: 2.3 K/uL (ref 0.7–4.0)
MCHC: 33.2 g/dL (ref 30.0–36.0)
MCV: 81.1 fl (ref 78.0–100.0)
Monocytes Absolute: 0.5 K/uL (ref 0.1–1.0)
Monocytes Relative: 8 % (ref 3.0–12.0)
Neutro Abs: 3.6 K/uL (ref 1.4–7.7)
Neutrophils Relative %: 53.2 % (ref 43.0–77.0)
Platelets: 360 K/uL (ref 150.0–400.0)
RBC: 4.54 Mil/uL (ref 3.87–5.11)
RDW: 13.8 % (ref 11.5–15.5)
WBC: 6.7 K/uL (ref 4.0–10.5)

## 2024-07-29 LAB — LIPID PANEL
Cholesterol: 312 mg/dL — ABNORMAL HIGH (ref 0–200)
HDL: 47.4 mg/dL (ref >39.00)
NonHDL: 264.86
Total CHOL/HDL Ratio: 7
Triglycerides: 411 mg/dL — ABNORMAL HIGH (ref 0.0–149.0)
VLDL: 82.2 mg/dL — ABNORMAL HIGH (ref 0.0–40.0)

## 2024-07-29 LAB — CK: Total CK: 133 U/L (ref 17–177)

## 2024-07-29 LAB — VITAMIN D 25 HYDROXY (VIT D DEFICIENCY, FRACTURES): VITD: 17.92 ng/mL — ABNORMAL LOW (ref 30.00–100.00)

## 2024-07-29 LAB — VITAMIN B12: Vitamin B-12: 1033 pg/mL — ABNORMAL HIGH (ref 211–911)

## 2024-07-29 LAB — LDL CHOLESTEROL, DIRECT: Direct LDL: 209 mg/dL

## 2024-07-29 LAB — C-REACTIVE PROTEIN: CRP: 0.5 mg/dL (ref 0.5–20.0)

## 2024-07-29 LAB — HEMOGLOBIN A1C: Hgb A1c MFr Bld: 6.1 % (ref 4.6–6.5)

## 2024-07-29 LAB — SEDIMENTATION RATE: Sed Rate: 47 mm/h — ABNORMAL HIGH (ref 0–30)

## 2024-07-29 MED ORDER — PHENTERMINE HCL 37.5 MG PO TABS: ORAL_TABLET | ORAL | 0 refills | Status: DC

## 2024-07-29 MED ORDER — PREDNISONE 20 MG PO TABS: 40.0000 mg | ORAL_TABLET | Freq: Every day | ORAL | 0 refills | Status: DC

## 2024-07-29 NOTE — Progress Notes (Signed)
 Subjective:     Patient ID: Sherri Wolf, female    DOB: 05-17-1966, 58 y.o.   MRN: [id number]  Chief Complaint  Patient presents with   Depression   Gastroesophageal Reflux   Hyperlipidemia   Fibromyalgia    Pt is here to go over chronic issues  Predm, hld  Discussed the use of AI scribe software for clinical note transcription with the patient, who gave verbal consent to proceed.  History of Present Illness Sherri Wolf is a 58 year old female with fibromyalgia who presents with worsening symptoms and foot pain.  She reports fibromyalgia symptoms, including widespread body pain, weakness, and fatigue. The pain is particularly severe in her upper arms and shoulders, with finger numbness, especially at night, preventing her from holding objects. Weakness in her arms and legs affects daily activities, such as working in the kitchen. Her fibromyalgia symptoms have returned.  She has left foot pain that began recently and reports some improvement with exercises and home treatments, such as rolling on a hard ball and using an ice bottle. The pain is exacerbated by standing for long periods at work and at home.  She has a history of prediabetes and is concerned about her inability to lose weight despite maintaining a good diet. She mentions mood swings and crying spells, which she attributes to possible premenopausal symptoms. No suicidal thoughts.  She experiences intermittent stomach pain, which occurs both when she is hungry and after eating. She follows a vegetarian diet and avoids outside food. Allergy  testing revealed an allergy  to [facility], and a blood test showed elevated tryptase levels. She takes Protonix  as needed for stomach issues.  She has difficulty driving at night due to vision problems, which she has discussed with an eye doctor. 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  She takes rosuvastatin  for high cholesterol  but has not noticed any change in her pain levels when discontinuing the medication. She takes vitamin D  over the counter but questions whether she needs a prescription due to low levels. She also takes cetirizine  and famotidine  for allergies and stomach issues, respectively.  She has a history of sleep apnea-wears cpap. She experiences nighttime leg cramps and swelling. Various tests, including ANA and rheumatoid factor, were negative.  She expresses concern about her husband's health, who is currently experiencing a persistent cough and cold.    Health Maintenance Due  Topic Date Due   HIV Screening  Never done   Hepatitis B Vaccines 19-59 Average Risk (1 of 3 - 19+ 3-dose series) Never done   Pneumococcal Vaccine: 50+ Years (1 of 1 - PCV) Never done   Zoster Vaccines- Shingrix (1 of 2) Never done    Past Medical History:  Diagnosis Date   Acute non-recurrent maxillary sinusitis 09/08/2020   Bilateral carpal tunnel syndrome 10/28/2019   Colon polyp 10/27/2017   COVID-19 ruled out by laboratory testing 09/08/2020   Depression, recurrent 10/21/2022   Edema leg 12/04/2021   Essential hypertension 05/28/2017   Formatting of this note is different from the original.  HTN, Started in June 2018  Had dry cough with Lisinopril.  Changed to Toprol  XL 25 mg August 2018     Last Assessment & Plan:   Formatting of this note might be different from the original.  Assessment:   Adequately controlled on medical therapy.   Compliant with meds and diet    Admits  to have difficulty in adjusting her night jjobs.       Excessive weight gain 06/06/2015   Last Assessment & Plan: Formatting of this note might be different from the original. A/P Cause of recent sudden weight gain is difficult to pin point. All lab w/u is unremarkable for systemic disease like HTN, DM, Hypothyroidism, renal disease (Nephrotic syndrome). Lupus and other connective tissue disease should be considered. Adv To have eval with  internist and Rheumatologist.   Fibromyalgia 12/04/2021   Gastroesophageal reflux disease without esophagitis 12/04/2021   Hypercholesteremia 06/06/2015   Formatting of this note might be different from the original. Last Assessment & Plan: Assessment:  The condition is stable  No recent labs  Body mass index is 32.06 kg/m. Plan:  Continue dietary measures  Aim is to achieve LDL level  below 70  Continue regular exercise  Continue current medical management.  Check labs prior to next visit has a current medication list which includes the fo   Hyperlipidemia    Hypertension 2017   resolved   Left ovarian cyst 10/27/2017   Low back pain 06/06/2015   Formatting of this note might be different from the original. Last Assessment & Plan: S: Still has right sided low back pain shooting down the leg. Also has rt shoulder pain which limits her arm movements. A/P Rec: Ortho eval, to r/o spinal stenosis. Last Assessment & Plan: Formatting of this note might be different from the original. S: Still has right sided low back pain shooting down the leg. A   Lower respiratory infection 09/08/2020   Migraine 10/27/2017   Formatting of this note might be different from the original. hx migraines, mri-mild periventricular white matter ischemic changes- 2018 June Formatting of this note might be different from the original. hx migraines, mri-mild periventricular white matter ischemic changes- 2018 June   Neck pain 10/28/2019   Numbness 10/28/2019   Obstructive sleep apnea (adult) (pediatric) 12/04/2021   Other chest pain 06/06/2015   Formatting of this note might be different from the original. 06/06/2015 EST echo 4 mins standard Bruce protocol. No CP Last Assessment & Plan: Formatting of this note might be different from the original. S: No cp. Still has shortness of breath while walking. A/P ReC: Pul evaluation, PFT,  to r/o asthma/ pul fibrosis.Echo is normal with no pulmonary HTN   Other specified  postprocedural states 10/27/2017   Formatting of this note might be different from the original. hx of congential vaginal atresia   Pain in right knee 04/30/2018   Peripheral edema 01/07/2021   Persistent cough 03/02/2020   Prediabetes 07/01/2019   Primary osteoarthritis of both first carpometacarpal joints 10/28/2019   Pruritus 12/04/2021   Right ovarian cyst 10/27/2017   Trigger middle finger of left hand 10/28/2019   Vitamin D  deficiency 12/30/2019    Past Surgical History:  Procedure Laterality Date   TOTAL VAGINAL HYSTERECTOMY  2019     Current Outpatient Medications:    azelastine  (ASTELIN ) 0.1 % nasal spray, Place 2 sprays into both nostrils 2 (two) times daily., Disp: 30 mL, Rfl: 1   Bempedoic Acid-Ezetimibe (NEXLIZET ) 180-10 MG TABS, Take 1 tablet by mouth daily in the afternoon., Disp: 90 tablet, Rfl: 3   cetirizine  (ZYRTEC ) 10 MG tablet, Take 1 tablet (10 mg total) by mouth in the morning and at bedtime., Disp: 180 tablet, Rfl: 1   cyanocobalamin  (VITAMIN B12) 1000 MCG tablet, Take 1 tablet by mouth daily., Disp: , Rfl:  diclofenac (VOLTAREN) 75 MG EC tablet, Take 1 tablet (75 mg total) by mouth 2 (two) times daily., Disp: 30 tablet, Rfl: 0   famotidine  (PEPCID ) 20 MG tablet, Take 1 tablet (20 mg total) by mouth 2 (two) times daily., Disp: 120 tablet, Rfl: 1   Multiple Vitamins-Minerals (ONCOVITE) TABS, Take 1 capsule by mouth daily., Disp: , Rfl:    OVER THE COUNTER MEDICATION, Pt taking vitamin D  1 a day, Disp: , Rfl:    pantoprazole  (PROTONIX ) 20 MG tablet, TAKE 1 TABLET (20 MG TOTAL) BY MOUTH 2 (TWO) TIMES DAILY BEFORE A MEAL. TAKE 1 PILL TWICE A DAY FOR 2 WEEKS, THEN 1 PILL EVERY MORNING., Disp: 180 tablet, Rfl: 0   phentermine (ADIPEX-P) 37.5 MG tablet, Take half tablet daily  PO prior to breakfast.After a week, may increase to full tablet in needed, Disp: 30 tablet, Rfl: 0   predniSONE  (DELTASONE ) 20 MG tablet, Take 2 tablets (40 mg total) by mouth daily with  breakfast for 5 days., Disp: 10 tablet, Rfl: 0   rosuvastatin  (CRESTOR ) 20 MG tablet, Take 1 tablet (20 mg total) by mouth daily., Disp: 90 tablet, Rfl: 1   Vitamin D , Ergocalciferol , (DRISDOL ) 1.25 MG (50000 UNIT) CAPS capsule, TAKE 1 CAPSULE (50,000 UNITS TOTAL) BY MOUTH EVERY 7 (SEVEN) DAYS, Disp: 12 capsule, Rfl: 0  No Known Allergies ROS neg/noncontributory except as noted HPI/below      Objective:     BP 118/70 (BP Location: Left Arm, Patient Position: Sitting)   Pulse 76   Temp 97.9 F (36.6 C) (Temporal)   Ht 5' 3 (1.6 m)   Wt 188 lb 4 oz (85.4 kg)   SpO2 98%   BMI 33.35 kg/m  Wt Readings from Last 3 Encounters:  07/29/24 188 lb 4 oz (85.4 kg)  07/22/24 186 lb 12.8 oz (84.7 kg)  06/06/24 187 lb 3.2 oz (84.9 kg)    Physical Exam GENERAL: Well developed, well nourished, no acute distress. HEAD EYES EARS NOSE THROAT: Normocephalic, atraumatic, conjunctiva not injected, sclera nonicteric. CARDIAC: Regular rate and rhythm, S1 S2 present, no murmur, dorsalis pedis 2 plus bilaterally. NECK: Supple, no thyromegaly, no nodes, no carotid bruits. LUNGS: Clear to auscultation bilaterally, no wheezes. ABDOMEN: Bowel sounds present, soft, non-tender, non-distended, no hepatosplenomegaly, no masses. EXTREMITIES: Mild Lymphedema in lower and upper extremities. MUSCULOSKELETAL: Weakness in upper extremities with flexion elbows, weakness in lower extremities with extension, pain in lower extremities with flexion, hardness in lower extremities, no gross abnormalities. NEUROLOGICAL: Alert and oriented x3, cranial nerves II through XII intact. PSYCHIATRIC: Normal mood, good eye contact.       Assessment & Plan:  Familial hyperlipidemia -     Comprehensive metabolic panel with GFR -     Lipid panel  Fibromyalgia -     CBC with Differential/Platelet -     Sedimentation rate -     Magnesium -     Rheumatoid factor -     C-reactive protein -     CK  Vitamin D  deficiency -      VITAMIN D  25 Hydroxy (Vit-D Deficiency, Fractures)  Prediabetes -     Comprehensive metabolic panel with GFR -     Hemoglobin A1c  Myalgia -     CBC with Differential/Platelet -     Sedimentation rate -     Magnesium -     Rheumatoid factor -     C-reactive protein -     CK  Low vitamin B12 level -  Vitamin B12  Other orders -     Phentermine HCl; Take half tablet daily  PO prior to breakfast.After a week, may increase to full tablet in needed  Dispense: 30 tablet; Refill: 0 -     predniSONE ; Take 2 tablets (40 mg total) by mouth daily with breakfast for 5 days.  Dispense: 10 tablet; Refill: 0 -     LDL cholesterol, direct    Assessment and Plan Assessment & Plan Fibromyalgia with generalized pain and weakness   Chronic fibromyalgia has worsened over the past 2-3 months, with severe pain affecting daily activities and numbness and tingling in the hands, especially at night. Weakness in the arms and legs causes difficulty lifting and holding objects. Previous holding rosuvastatin  trials did not help. Prednisone  is prescribed for acute flare-up management. She is referred to sports medicine for further evaluation. Hormone therapy for mood and energy was discussed, but she prefers to avoid medication. Also, consideration of PMR-check labs.    Lymphedema   Chronic lymphedema causes leg swelling, hardness, and nighttime discomfort and cramping. Previous evaluations have not identified a clear cause. Magnesium deficiency is considered a contributing factor, and a magnesium level is ordered. Declines diuretics-I agree  Obesity and weight management   Obesity persists despite dietary efforts. Weight loss medications were discussed, but insurance coverage is limited. Phentermine is considered for short-term management, with potential side effects like heart palpitations and insomnia. She is open to trying phentermine after completing the prednisone  course. Phentermine is prescribed,  starting with half a pill daily to assess side effects. PDMP checked.  Will qualify for glp-1 since OSA-but needs another appt to discuss  Other hyperlipidemia  - familial Hyperlipidemia is managed with rosuvastatin  and nexlizet . . Rosuvastatin  will continue.  Prediabetes   Ongoing monitoring is in place, with discussions on potential diabetes progression and treatment implications. An A1c test is ordered to assess glycemic control.  Vitamin D  deficiency   Vitamin D  levels are ordered to assess current status, with consideration for prescription supplementation if levels remain low.  Obstructive sleep apnea   Severe obstructive sleep apnea is present. Insurance coverage for weight loss medications due to sleep apnea diagnosis was discussed.  Mood disturbance   Mood swings and crying spells are present. Hormone therapy for mood improvement was discussed, but she prefers to avoid medication.  Chronic gastritis/dyspepsia   Intermittent stomach pain and dyspepsia occur, not consistently related to food intake. Previous allergy  testing showed elevated tryptase levels without a clear cause. Protonix  is used as needed. Famotidine  is refilled for as-needed use, and symptoms and dietary triggers will continue to be monitored.  General Health Maintenance   General health maintenance discussions included vitamin D  and B12 levels and potential magnesium supplementation. B12 and magnesium levels are ordered to assess current status.     Return for within next 2 wks-weight and other.  Jenkins CHRISTELLA Carrel, MD

## 2024-07-29 NOTE — Patient Instructions (Signed)
 It was very nice to see you today!  Prednisone   [facility] at [facility]  [address] on the 1st floor Phone number [phone]    PLEASE NOTE:  If you had any lab tests please let us  know if you have not heard back within a few days. You may see your results on MyChart before we have a chance to review them but we will give you a call once they are reviewed by us . If we ordered any referrals today, please let us  know if you have not heard from their office within the next week.   Please try these tips to maintain a healthy lifestyle:  Eat most of your calories during the day when you are active. Eliminate processed foods including packaged sweets (pies, cakes, cookies), reduce intake of potatoes, white bread, white pasta, and white rice. Look for whole grain options, oat flour or almond flour.  Each meal should contain half fruits/vegetables, one quarter protein, and one quarter carbs (no bigger than a computer mouse).  Cut down on sweet beverages. This includes juice, soda, and sweet tea. Also watch fruit intake, though this is a healthier sweet option, it still contains natural sugar! Limit to 3 servings daily.  Drink at least 1 glass of water with each meal and aim for at least 8 glasses per day  Exercise at least 150 minutes every week.

## 2024-07-30 LAB — RHEUMATOID FACTOR: Rheumatoid fact SerPl-aCnc: <10 [IU]/mL (ref <14)

## 2024-07-31 ENCOUNTER — Ambulatory Visit: Payer: Self-pay | Admitting: Family Medicine

## 2024-07-31 MED ORDER — VITAMIN D (ERGOCALCIFEROL) 1.25 MG (50000 UNIT) PO CAPS: 50000.0000 [IU] | ORAL_CAPSULE | ORAL | 1 refills | Status: AC

## 2024-07-31 NOTE — Progress Notes (Signed)
 Cholesterol still way too high.  Is she taking both meds regularly(or run out?).  I would recommend starting her on the injectable-every 2 wks-repatha if agreeable.  She would still take the others for now as well.   2.  Also, the sed rate is a little high-did the prednisone  help?  Has she ever seen rheumatology? 3.  A1C(3 month average of sugars) is elevated.  This is considered PreDiabetes.  Work on diet-decrease sugars and starches and aim for 30 minutes of exercise 5 days/week to prevent progression to diabetes  4.  D low-I sent in rx

## 2024-08-01 ENCOUNTER — Other Ambulatory Visit: Payer: Self-pay | Admitting: Family Medicine

## 2024-08-01 DIAGNOSIS — R700 Elevated erythrocyte sedimentation rate: Secondary | ICD-10-CM

## 2024-08-01 DIAGNOSIS — R531 Weakness: Secondary | ICD-10-CM

## 2024-08-01 DIAGNOSIS — M791 Myalgia, unspecified site: Secondary | ICD-10-CM

## 2024-08-01 DIAGNOSIS — E7849 Other hyperlipidemia: Secondary | ICD-10-CM

## 2024-08-01 MED ORDER — REPATHA SURECLICK 140 MG/ML ~~LOC~~ SOAJ: 140.0000 mg | SUBCUTANEOUS | 2 refills | Status: AC

## 2024-08-03 ENCOUNTER — Telehealth: Payer: Self-pay

## 2024-08-03 ENCOUNTER — Other Ambulatory Visit: Payer: Self-pay | Admitting: Family Medicine

## 2024-08-03 DIAGNOSIS — R531 Weakness: Secondary | ICD-10-CM

## 2024-08-03 DIAGNOSIS — M791 Myalgia, unspecified site: Secondary | ICD-10-CM

## 2024-08-03 DIAGNOSIS — R700 Elevated erythrocyte sedimentation rate: Secondary | ICD-10-CM

## 2024-08-03 MED ORDER — PREDNISONE 20 MG PO TABS: 20.0000 mg | ORAL_TABLET | Freq: Every day | ORAL | 0 refills | Status: DC

## 2024-08-03 NOTE — Telephone Encounter (Signed)
 Pharmacy Patient Advocate Encounter   Received notification from Onbase that prior authorization for Repatha SureClick 140MG /ML auto-injectors is required/requested.   Insurance verification completed.   The patient is insured through [facility] MEDICAID.   Per test claim: PA required; PA submitted to above mentioned insurance via Latent Key/confirmation #/EOC [id number] Status is pending

## 2024-08-03 NOTE — Telephone Encounter (Signed)
 Pharmacy Patient Advocate Encounter  Received notification from [facility] MEDICAID that Prior Authorization for Repatha Sureclick Auto-injectors has been APPROVED from 08/03/24 to 08/03/25   PA #/Case ID/Reference #: [id number]

## 2024-08-08 ENCOUNTER — Ambulatory Visit: Payer: Self-pay

## 2024-08-08 NOTE — Telephone Encounter (Signed)
  FYI Only or Action Required?: FYI only for provider: Scheduled for 11/20, unwilling to move up appointment due to transportation.  Patient was last seen in primary care on 07/29/2024 by Wendolyn Jenkins Jansky, MD.  Called Nurse Triage reporting Hypertension.  Symptoms began today.  Interventions attempted: Nothing.  Symptoms are: unchanged.  Triage Disposition: See Physician Within 24 Hours  Patient/caregiver understands and will follow disposition?: no Reason for Triage: Patient currently taking phentermine (ADIPEX-P) 37.5 MG tablet   feel as if is making her blood pressure high. Was suppose to take a half a tablet for the first week and then increase to a full tablet if NEEDED. She started it on friday 11/14 and was taking half a pill and today she took a whole pill.   Reason for Disposition  Systolic BP >= 180 OR Diastolic >= 110  Answer Assessment - Initial Assessment Questions 1. BLOOD PRESSURE: What is your blood pressure? Did you take at least two measurements 5 minutes apart?     187/100 at 1pm 2. ONSET: When did you take your blood pressure?     Four hours after taking accidentally taking full tablet of phentermine 3. HOW: How did you take your blood pressure? (e.g., automatic home BP monitor, visiting nurse)     Automatic cuff 4. HISTORY: Do you have a history of high blood pressure?     Many years ago, but currently not treating HTN 5. MEDICINES: Are you taking any medicines for blood pressure? Have you missed any doses recently?     none 6. OTHER SYMPTOMS: Do you have any symptoms? (e.g., blurred vision, chest pain, difficulty breathing, headache, weakness)     dry mouth  Protocols used: Blood Pressure - High-A-AH

## 2024-08-08 NOTE — Telephone Encounter (Signed)
 Noted

## 2024-08-10 ENCOUNTER — Encounter: Payer: Self-pay | Admitting: Physician Assistant

## 2024-08-10 ENCOUNTER — Telehealth: Admitting: Physician Assistant

## 2024-08-10 DIAGNOSIS — B309 Viral conjunctivitis, unspecified: Secondary | ICD-10-CM

## 2024-08-10 DIAGNOSIS — J069 Acute upper respiratory infection, unspecified: Secondary | ICD-10-CM

## 2024-08-10 DIAGNOSIS — Z789 Other specified health status: Secondary | ICD-10-CM | POA: Diagnosis not present

## 2024-08-10 MED ORDER — CETIRIZINE HCL 10 MG PO TABS: 10.0000 mg | ORAL_TABLET | Freq: Every day | ORAL | 0 refills | Status: AC

## 2024-08-10 MED ORDER — AZELASTINE HCL 0.1 % NA SOLN: 2.0000 | Freq: Two times a day (BID) | NASAL | 1 refills | Status: AC

## 2024-08-10 MED ORDER — AZELASTINE HCL 0.05 % OP SOLN: 1.0000 [drp] | Freq: Two times a day (BID) | OPHTHALMIC | 0 refills | Status: AC

## 2024-08-10 NOTE — Progress Notes (Signed)
 Virtual Visit Consent   Sherri Wolf, you are scheduled for a virtual visit with a [facility] provider today. Just as with appointments in the office, your consent must be obtained to participate. Your consent will be active for this visit and any virtual visit you may have with one of our providers in the next 365 days. If you have a MyChart account, a copy of this consent can be sent to you electronically.  As this is a virtual visit, video technology does not allow for your provider to perform a traditional examination. This may limit your provider's ability to fully assess your condition. If your provider identifies any concerns that need to be evaluated in person or the need to arrange testing (such as labs, EKG, etc.), we will make arrangements to do so. Although advances in technology are sophisticated, we cannot ensure that it will always work on either your end or our end. If the connection with a video visit is poor, the visit may have to be switched to a telephone visit. With either a video or telephone visit, we are not always able to ensure that we have a secure connection.  By engaging in this virtual visit, you consent to the provision of healthcare and authorize for your insurance to be billed (if applicable) for the services provided during this visit. Depending on your insurance coverage, you may receive a charge related to this service.  I need to obtain your verbal consent now. Are you willing to proceed with your visit today? Sherri Wolf has provided verbal consent on 08/10/2024 for a virtual visit (video or telephone). Sherri Wolf, [place]  Date: 08/10/2024 9:10 AM   Virtual Visit via Video Note   I, Sherri Wolf, connected with  Sherri Wolf  ([id number], November 02, 1965) on 08/10/24 at  9:00 AM EST by a video-enabled telemedicine application and verified that I am speaking with the correct person using two identifiers.  Location: Patient: Virtual Visit Location Patient:  Home Provider: Virtual Visit Location Provider: Home Office   I discussed the limitations of evaluation and management by telemedicine and the availability of in person appointments. The patient expressed understanding and agreed to proceed.    History of Present Illness: Sherri Wolf is a 58 y.o. who identifies as a female who was assigned female at birth, and is being seen today for multiple concerns today.   Endorses yesterday starting with headache, aches, chills, eye redness, irritation and watering with nasal congestion and mild cough. Denies fever. Denies recent travel. 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 Denies chest pain or SOB.  Patient also noting elevated BP and heartburn with use of Phentermine recently started by her PCP. Notes initially to take 1/2 tablet for a week before increasing. After increasing to whole tablet noting feeling slightly off. BP was 179/109 at that time so she called her PCP office. Also noting intermittent heartburn at night since starting even the lower dose of phentermine. Was instructed to stop Phentermine and follow-up with them in office. Has appt scheduled for tomorrow. Notes repeat BP at 141/87 and 150/87 so much improved. Patient denies chest pain, palpitations, lightheadedness, dizziness, vision changes or frequent headaches. Heartburn alleviated with her Pantoprazole  and Famotidine .   HPI: HPI  Problems:  Patient Active Problem List   Diagnosis Date Noted   OSA on CPAP 04/25/2024   Non-seasonal allergic rhinitis due to pollen 11/16/2023   Familial hyperlipidemia 11/18/2022   Chest pain of uncertain etiology 11/18/2022  Depression, recurrent 10/21/2022   Hyperlipidemia 12/04/2021   Edema leg 12/04/2021   Fibromyalgia 12/04/2021   Gastroesophageal reflux disease without esophagitis 12/04/2021   Pruritus 12/04/2021   Peripheral edema 01/07/2021   Acute non-recurrent maxillary sinusitis 09/08/2020   COVID-19 ruled out by laboratory  testing 09/08/2020   Lower respiratory infection 09/08/2020   Persistent cough 03/02/2020   Vitamin D  deficiency 12/30/2019   Bilateral carpal tunnel syndrome 10/28/2019   Numbness 10/28/2019   Primary osteoarthritis of both first carpometacarpal joints 10/28/2019   Trigger middle finger of left hand 10/28/2019   Neck pain 10/28/2019   Prediabetes 07/01/2019   Pain in right knee 04/30/2018   Colon polyp 10/27/2017   Migraine 10/27/2017   Other specified postprocedural states 10/27/2017   Right ovarian cyst 10/27/2017   Left ovarian cyst 10/27/2017   Essential hypertension 05/28/2017   Hypertension 2017   Excessive weight gain 06/06/2015   Low back pain 06/06/2015   Other chest pain 06/06/2015   Hypercholesteremia 06/06/2015    Allergies: No Known Allergies Medications:  Current Outpatient Medications:    azelastine  (OPTIVAR ) 0.05 % ophthalmic solution, Place 1 drop into both eyes 2 (two) times daily., Disp: 6 mL, Rfl: 0   azelastine  (ASTELIN ) 0.1 % nasal spray, Place 2 sprays into both nostrils 2 (two) times daily., Disp: 30 mL, Rfl: 1   Bempedoic Acid-Ezetimibe (NEXLIZET ) 180-10 MG TABS, Take 1 tablet by mouth daily in the afternoon., Disp: 90 tablet, Rfl: 3   cetirizine  (ZYRTEC ) 10 MG tablet, Take 1 tablet (10 mg total) by mouth daily., Disp: 90 tablet, Rfl: 0   cyanocobalamin  (VITAMIN B12) 1000 MCG tablet, Take 1 tablet by mouth daily., Disp: , Rfl:    diclofenac  (VOLTAREN ) 75 MG EC tablet, Take 1 tablet (75 mg total) by mouth 2 (two) times daily., Disp: 30 tablet, Rfl: 0   Evolocumab  (REPATHA  SURECLICK) 140 MG/ML SOAJ, Inject 140 mg into the skin every 14 (fourteen) days., Disp: 2 mL, Rfl: 2   famotidine  (PEPCID ) 20 MG tablet, Take 1 tablet (20 mg total) by mouth 2 (two) times daily., Disp: 120 tablet, Rfl: 1   Multiple Vitamins-Minerals (ONCOVITE) TABS, Take 1 capsule by mouth daily., Disp: , Rfl:    OVER THE COUNTER MEDICATION, Pt taking vitamin D  1 a day, Disp: , Rfl:     pantoprazole  (PROTONIX ) 20 MG tablet, TAKE 1 TABLET (20 MG TOTAL) BY MOUTH 2 (TWO) TIMES DAILY BEFORE A MEAL. TAKE 1 PILL TWICE A DAY FOR 2 WEEKS, THEN 1 PILL EVERY MORNING., Disp: 180 tablet, Rfl: 0   phentermine  (ADIPEX-P ) 37.5 MG tablet, Take half tablet daily  PO prior to breakfast.After a week, may increase to full tablet in needed, Disp: 30 tablet, Rfl: 0   predniSONE  (DELTASONE ) 20 MG tablet, Take 1 tablet (20 mg total) by mouth daily with breakfast., Disp: 30 tablet, Rfl: 0   rosuvastatin  (CRESTOR ) 20 MG tablet, Take 1 tablet (20 mg total) by mouth daily., Disp: 90 tablet, Rfl: 1   Vitamin D , Ergocalciferol , (DRISDOL ) 1.25 MG (50000 UNIT) CAPS capsule, Take 1 capsule (50,000 Units total) by mouth every 7 (seven) days., Disp: 12 capsule, Rfl: 1  Observations/Objective: Patient is well-developed, well-nourished in no acute distress.  Resting comfortably at home.  Head is normocephalic, atraumatic.  No labored breathing.  Speech is clear and coherent with logical content.  Patient is alert and oriented at baseline.  Bilateral conjunctival injection noted with tearing bilaterally. No lid swelling or lesion noted. Pupils are equal  and round. EOMI.  Assessment and Plan: 1. Viral URI (Primary) - azelastine  (ASTELIN ) 0.1 % nasal spray; Place 2 sprays into both nostrils 2 (two) times daily.  Dispense: 30 mL; Refill: 1 - cetirizine  (ZYRTEC ) 10 MG tablet; Take 1 tablet (10 mg total) by mouth daily.  Dispense: 90 tablet; Refill: 0 2. Viral conjunctivitis - azelastine  (OPTIVAR ) 0.05 % ophthalmic solution; Place 1 drop into both eyes 2 (two) times daily.  Dispense: 6 mL; Refill: 0  Will have her take a home COVID/Flu test as a precaution. She is to message me directly via MyChart once she has results in case we need to add on additional treatments/quarantine. Will have her increase fluids. Tylenol OTC for headache, aches or sore throat. Avoid NSAIDs due to history of GERD and BP elevations. Will have  her restart her Astelin  and Cetirizine  -- refill sent. Optivar  per orders. Work note provided -- will addend if COVID positive.  3. Medication intolerance  Has stopped Phentermine. BP coming back down thankfully. Suspect mild residual elevation related to her current acute illness. She is to monitor closely and follow-up with PCP tomorrow as scheduled.   Follow Up Instructions: I discussed the assessment and treatment plan with the patient. The patient was provided an opportunity to ask questions and all were answered. The patient agreed with the plan and demonstrated an understanding of the instructions.  A copy of instructions were sent to the patient via MyChart unless otherwise noted below.   The patient was advised to call back or seek an in-person evaluation if the symptoms worsen or if the condition fails to improve as anticipated.    Sherri Velma Lunger, PA-C

## 2024-08-10 NOTE — Patient Instructions (Signed)
 Sherri Wolf, thank you for joining [name], PA-C for today's virtual visit.  While this provider is not your primary care provider (PCP), if your PCP is located in our provider database this encounter information will be shared with them immediately following your visit.   [facility] MyChart account gives you access to today's visit and all your visits, tests, and labs performed at [facility]  click here if you don't have a [facility] MyChart account or go to [email or web address]  Consent: (Patient) Sherri Wolf provided verbal consent for this virtual visit at the beginning of the encounter.  Current Medications:  Current Outpatient Medications:    azelastine  (ASTELIN ) 0.1 % nasal spray, Place 2 sprays into both nostrils 2 (two) times daily., Disp: 30 mL, Rfl: 1   Bempedoic Acid-Ezetimibe (NEXLIZET ) 180-10 MG TABS, Take 1 tablet by mouth daily in the afternoon., Disp: 90 tablet, Rfl: 3   cetirizine  (ZYRTEC ) 10 MG tablet, Take 1 tablet (10 mg total) by mouth in the morning and at bedtime., Disp: 180 tablet, Rfl: 1   cyanocobalamin  (VITAMIN B12) 1000 MCG tablet, Take 1 tablet by mouth daily., Disp: , Rfl:    diclofenac (VOLTAREN) 75 MG EC tablet, Take 1 tablet (75 mg total) by mouth 2 (two) times daily., Disp: 30 tablet, Rfl: 0   Evolocumab (REPATHA SURECLICK) 140 MG/ML SOAJ, Inject 140 mg into the skin every 14 (fourteen) days., Disp: 2 mL, Rfl: 2   famotidine  (PEPCID ) 20 MG tablet, Take 1 tablet (20 mg total) by mouth 2 (two) times daily., Disp: 120 tablet, Rfl: 1   Multiple Vitamins-Minerals (ONCOVITE) TABS, Take 1 capsule by mouth daily., Disp: , Rfl:    OVER THE COUNTER MEDICATION, Pt taking vitamin D  1 a day, Disp: , Rfl:    pantoprazole  (PROTONIX ) 20 MG tablet, TAKE 1 TABLET (20 MG TOTAL) BY MOUTH 2 (TWO) TIMES DAILY BEFORE A MEAL. TAKE 1 PILL TWICE A DAY FOR 2 WEEKS, THEN 1 PILL EVERY MORNING., Disp: 180 tablet, Rfl: 0   phentermine (ADIPEX-P) 37.5 MG  tablet, Take half tablet daily  PO prior to breakfast.After a week, may increase to full tablet in needed, Disp: 30 tablet, Rfl: 0   predniSONE  (DELTASONE ) 20 MG tablet, Take 1 tablet (20 mg total) by mouth daily with breakfast., Disp: 30 tablet, Rfl: 0   rosuvastatin  (CRESTOR ) 20 MG tablet, Take 1 tablet (20 mg total) by mouth daily., Disp: 90 tablet, Rfl: 1   Vitamin D , Ergocalciferol , (DRISDOL ) 1.25 MG (50000 UNIT) CAPS capsule, Take 1 capsule (50,000 Units total) by mouth every 7 (seven) days., Disp: 12 capsule, Rfl: 1   Medications ordered in this encounter:  No orders of the defined types were placed in this encounter.    *If you need refills on other medications prior to your next appointment, please contact your pharmacy*  Follow-Up: Call back or seek an in-person evaluation if the symptoms worsen or if the condition fails to improve as anticipated.  [facility] [phone]  Other Instructions Please take the home COVID/Flu test as discussed, messaging me with your results so we can discuss additional treatments if needed.  Keep hydrated and rest. Start a saline nasal rinse. Restart your Astelin  nasal spray and Cetirizine  (refills have been sent in). Tylenol OTC as needed. Use the prescription eye drops I have sent in. If you note any non-resolving, new, or worsening symptoms despite treatment, please seek an in-person evaluation ASAP.    If you have  been instructed to have an in-person evaluation today at a local Urgent Care facility, please use the link below. It will take you to a list of all of our available [facility], including address, phone number and hours of operation. Please do not delay care.  [facility]  If you or a family member do not have a primary care provider, use the link below to schedule a visit and establish care. When you choose a [facility] primary care physician or advanced practice provider, you gain a  long-term partner in health. Find a Primary Care Provider  Learn more about [facility]'s in-office and virtual care options: [facility] - Get Care Now

## 2024-08-11 ENCOUNTER — Ambulatory Visit (INDEPENDENT_AMBULATORY_CARE_PROVIDER_SITE_OTHER): Admitting: Family Medicine

## 2024-08-11 ENCOUNTER — Encounter: Payer: Self-pay | Admitting: Family Medicine

## 2024-08-11 ENCOUNTER — Telehealth: Payer: Self-pay

## 2024-08-11 ENCOUNTER — Other Ambulatory Visit (HOSPITAL_COMMUNITY): Payer: Self-pay

## 2024-08-11 ENCOUNTER — Ambulatory Visit: Payer: Self-pay | Admitting: Family Medicine

## 2024-08-11 VITALS — BP 132/80 | HR 76 | Temp 97.5°F | Ht 63.0 in | Wt 179.2 lb

## 2024-08-11 DIAGNOSIS — R7303 Prediabetes: Secondary | ICD-10-CM | POA: Diagnosis not present

## 2024-08-11 DIAGNOSIS — G4733 Obstructive sleep apnea (adult) (pediatric): Secondary | ICD-10-CM

## 2024-08-11 DIAGNOSIS — R700 Elevated erythrocyte sedimentation rate: Secondary | ICD-10-CM | POA: Diagnosis not present

## 2024-08-11 DIAGNOSIS — E559 Vitamin D deficiency, unspecified: Secondary | ICD-10-CM

## 2024-08-11 DIAGNOSIS — E7849 Other hyperlipidemia: Secondary | ICD-10-CM

## 2024-08-11 LAB — SEDIMENTATION RATE: Sed Rate: 15 mm/h (ref 0–30)

## 2024-08-11 MED ORDER — TIRZEPATIDE 2.5 MG/0.5ML ~~LOC~~ SOAJ: 2.5000 mg | SUBCUTANEOUS | 0 refills | Status: DC

## 2024-08-11 MED ORDER — PREDNISONE 20 MG PO TABS: 20.0000 mg | ORAL_TABLET | Freq: Every day | ORAL | 1 refills | Status: DC

## 2024-08-11 MED ORDER — ZEPBOUND 2.5 MG/0.5ML ~~LOC~~ SOAJ: 2.5000 mg | SUBCUTANEOUS | 0 refills | Status: DC

## 2024-08-11 MED ORDER — FAMOTIDINE 20 MG PO TABS: 20.0000 mg | ORAL_TABLET | Freq: Two times a day (BID) | ORAL | 1 refills | Status: DC

## 2024-08-11 NOTE — Telephone Encounter (Signed)
 Ozempic/Mounjaro  is approved exclusively as an adjunct to diet and exercise to improve glycemic  control in adults with type 2 diabetes mellitus. A review of patient's medical chart reveals no  documented diagnosis of type 2 diabetes or an A1C 6.5 indicative of diabetes. Therefore, they do not  currently meet the criteria for prior authorization of this medication.

## 2024-08-11 NOTE — Progress Notes (Signed)
 Called pt and notified smk

## 2024-08-11 NOTE — Addendum Note (Signed)
 Addended by: WENDOLYN JENKINS HERO on: 08/11/2024 05:45 PM   Modules accepted: Orders

## 2024-08-11 NOTE — Progress Notes (Signed)
 Great.  Do the 20mg  prednisone  tomorrow and then do 1/2 tabs(so 10mg  daily).  If symptoms worsen, can go back to the 20.

## 2024-08-11 NOTE — Patient Instructions (Signed)
 It was very nice to see you today!  Colace 100-200mg  and miralax.    PLEASE NOTE:  If you had any lab tests please let us  know if you have not heard back within a few days. You may see your results on MyChart before we have a chance to review them but we will give you a call once they are reviewed by us . If we ordered any referrals today, please let us  know if you have not heard from their office within the next week.   Please try these tips to maintain a healthy lifestyle:  Eat most of your calories during the day when you are active. Eliminate processed foods including packaged sweets (pies, cakes, cookies), reduce intake of potatoes, white bread, white pasta, and white rice. Look for whole grain options, oat flour or almond flour.  Each meal should contain half fruits/vegetables, one quarter protein, and one quarter carbs (no bigger than a computer mouse).  Cut down on sweet beverages. This includes juice, soda, and sweet tea. Also watch fruit intake, though this is a healthier sweet option, it still contains natural sugar! Limit to 3 servings daily.  Drink at least 1 glass of water with each meal and aim for at least 8 glasses per day  Exercise at least 150 minutes every week.

## 2024-08-11 NOTE — Progress Notes (Signed)
 Subjective:     Patient ID: Sherri Wolf, female    DOB: [birth year], 58 y.o.   MRN: [id number]  Chief Complaint  Patient presents with   Weight Check    Pt is here to for weight check    Discussed the use of AI scribe software for clinical note transcription with the patient, who gave verbal consent to proceed.  History of Present Illness Sherri Wolf is a 58 year old female with possible polymyalgia rheumatica and familial hyperlipidemia who presents for follow-up on her weight and cholesterol management.  She is currently taking rosuvastatin  and Nexlizet  for her cholesterol but experiences significant constipation and dryness as side effects so had been skipping doses.  Back on track.  Did receive repatha , but not taken yet. She manages these symptoms with prunes, fiber, and other remedies.  She experiences body pain, particularly in the hips, glutes, and hands, described as soreness similar to post-injection pain. Prednisone  has provided some improvement, reducing weakness , but she still experiences significant discomfort, especially at night.  She has been prescribed phentermine  for weight loss and has lost about ten pounds. However, she experienced a significant increase in blood pressure and other side effects after taking a full pill, leading her to stop the medication temporarily. She is considering continuing with a half pill dose.  She has been experiencing eye issues, including redness, swelling, and pain, which she attributes to a possible side effect of medication. She is using eye drops, which have provided some relief.  She reports leg cramps, particularly in the morning and at night, which are severe and painful.   Her current medications include rosuvastatin , Nexliz, prednisone , and eye drops. She has also been advised to take vitamin D  and B12 supplements. She is a vegetarian and has made dietary changes, such as reducing carbohydrate intake and incorporating yoga and oil  massages into her routine. Has appt w/sports med next wk  Poss PMR-on pred 20mg  daily.  Discussed not stopping meds abruptly.  Some improvement in symptoms.  Hard to discern symptoms as fibromyalgia as well.  No appt avail till Feb w/rheum.      There are no preventive care reminders to display for this patient.   Past Medical History:  Diagnosis Date   Acute non-recurrent maxillary sinusitis 09/08/2020   Bilateral carpal tunnel syndrome 10/28/2019   Colon polyp 10/27/2017   COVID-19 ruled out by laboratory testing 09/08/2020   Depression, recurrent 10/21/2022   Edema leg 12/04/2021   Essential hypertension 09/23/2015   Formatting of this note is different from the original.  HTN, Started in June 2018  Had dry cough with Lisinopril.  Changed to Toprol  XL 25 mg August 2018     Last Assessment & Plan:   Formatting of this note might be different from the original.  Assessment:   Adequately controlled on medical therapy.   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       Excessive weight gain 06/06/2015   Last Assessment & Plan: Formatting of this note might be different from the original. A/P Cause of recent sudden weight gain is difficult to pin point. All lab w/u is unremarkable for systemic disease like HTN, DM, Hypothyroidism, renal disease (Nephrotic syndrome). Lupus and other connective tissue disease should be considered. Adv To have eval with internist and Rheumatologist.   Fibromyalgia 12/04/2021   Gastroesophageal reflux disease without esophagitis 12/04/2021   Hypercholesteremia 06/06/2015  Formatting of this note might be different from the original. Last Assessment & Plan: Assessment:  The condition is stable  No recent labs  Body mass index is 32.06 kg/m. Plan:  Continue dietary measures  Aim is to achieve LDL level  below 70  Continue regular exercise  Continue current medical management.  Check labs prior to next visit has  a current medication list which includes the fo   Hyperlipidemia    Hypertension 2017   resolved   Left ovarian cyst 10/27/2017   Low back pain 06/06/2015   Formatting of this note might be different from the original. Last Assessment & Plan: S: Still has right sided low back pain shooting down the leg. Also has rt shoulder pain which limits her arm movements. A/P Rec: Ortho eval, to r/o spinal stenosis. Last Assessment & Plan: Formatting of this note might be different from the original. S: Still has right sided low back pain shooting down the leg. A   Lower respiratory infection 09/08/2020   Migraine 10/27/2017   Formatting of this note might be different from the original. hx migraines, mri-mild periventricular white matter ischemic changes- 2018 June Formatting of this note might be different from the original. hx migraines, mri-mild periventricular white matter ischemic changes- 2018 June   Neck pain 10/28/2019   Numbness 10/28/2019   Obstructive sleep apnea (adult) (pediatric) 12/04/2021   Other chest pain 06/06/2015   Formatting of this note might be different from the original. 06/06/2015 EST echo 4 mins standard Bruce protocol. No CP Last Assessment & Plan: Formatting of this note might be different from the original. S: No cp. Still has shortness of breath while walking. A/P ReC: Pul evaluation, PFT,  to r/o asthma/ pul fibrosis.Echo is normal with no pulmonary HTN   Other specified postprocedural states 10/27/2017   Formatting of this note might be different from the original. hx of congential vaginal atresia   Pain in right knee 04/30/2018   Peripheral edema 01/07/2021   Persistent cough 03/02/2020   Prediabetes 07/01/2019   Primary osteoarthritis of both first carpometacarpal joints 10/28/2019   Pruritus 12/04/2021   Right ovarian cyst 10/27/2017   Trigger middle finger of left hand 10/28/2019   Vitamin D  deficiency 12/30/2019    Past Surgical History:  Procedure Laterality  Date   TOTAL VAGINAL HYSTERECTOMY  2019     Current Outpatient Medications:    azelastine  (ASTELIN ) 0.1 % nasal spray, Place 2 sprays into both nostrils 2 (two) times daily., Disp: 30 mL, Rfl: 1   azelastine  (OPTIVAR ) 0.05 % ophthalmic solution, Place 1 drop into both eyes 2 (two) times daily., Disp: 6 mL, Rfl: 0   Bempedoic Acid-Ezetimibe (NEXLIZET ) 180-10 MG TABS, Take 1 tablet by mouth daily in the afternoon., Disp: 90 tablet, Rfl: 3   cetirizine  (ZYRTEC ) 10 MG tablet, Take 1 tablet (10 mg total) by mouth daily., Disp: 90 tablet, Rfl: 0   cyanocobalamin  (VITAMIN B12) 1000 MCG tablet, Take 1 tablet by mouth daily., Disp: , Rfl:    diclofenac (VOLTAREN) 75 MG EC tablet, Take 1 tablet (75 mg total) by mouth 2 (two) times daily., Disp: 30 tablet, Rfl: 0   Evolocumab (REPATHA SURECLICK) 140 MG/ML SOAJ, Inject 140 mg into the skin every 14 (fourteen) days., Disp: 2 mL, Rfl: 2   Multiple Vitamins-Minerals (ONCOVITE) TABS, Take 1 capsule by mouth daily., Disp: , Rfl:    OVER THE COUNTER MEDICATION, Pt taking vitamin D  1 a day, Disp: , Rfl:  pantoprazole  (PROTONIX ) 20 MG tablet, TAKE 1 TABLET (20 MG TOTAL) BY MOUTH 2 (TWO) TIMES DAILY BEFORE A MEAL. TAKE 1 PILL TWICE A DAY FOR 2 WEEKS, THEN 1 PILL EVERY MORNING., Disp: 180 tablet, Rfl: 0   phentermine  (ADIPEX-P ) 37.5 MG tablet, Take half tablet daily  PO prior to breakfast.After a week, may increase to full tablet in needed, Disp: 30 tablet, Rfl: 0   rosuvastatin  (CRESTOR ) 20 MG tablet, Take 1 tablet (20 mg total) by mouth daily., Disp: 90 tablet, Rfl: 1   tirzepatide  (MOUNJARO ) 2.5 MG/0.5ML Pen, Inject 2.5 mg into the skin once a week., Disp: 2 mL, Rfl: 0   Vitamin D , Ergocalciferol , (DRISDOL ) 1.25 MG (50000 UNIT) CAPS capsule, Take 1 capsule (50,000 Units total) by mouth every 7 (seven) days., Disp: 12 capsule, Rfl: 1   famotidine  (PEPCID ) 20 MG tablet, Take 1 tablet (20 mg total) by mouth 2 (two) times daily., Disp: 180 tablet, Rfl: 1    predniSONE  (DELTASONE ) 20 MG tablet, Take 1 tablet (20 mg total) by mouth daily with breakfast., Disp: 30 tablet, Rfl: 1  No Known Allergies ROS neg/noncontributory except as noted HPI/below      Objective:     BP 132/80 (BP Location: Left Arm, Patient Position: Sitting, Cuff Size: Normal)   Pulse 76   Temp (!) 97.5 F (36.4 C) (Temporal)   Ht 5' 3 (1.6 m)   Wt 179 lb 4 oz (81.3 kg)   SpO2 98%   BMI 31.75 kg/m  Wt Readings from Last 3 Encounters:  08/11/24 179 lb 4 oz (81.3 kg)  07/29/24 188 lb 4 oz (85.4 kg)  07/22/24 186 lb 12.8 oz (84.7 kg)    Physical Exam GENERAL: Well developed, well nourished, no acute distress. HEAD EYES EARS NOSE THROAT: Normocephalic, atraumatic, conjunctiva not injected, sclera nonicteric, eyes normal. CARDIAC: Regular rate and rhythm, S1 S2 present, no murmur. NECK: Supple, no thyromegaly, no nodes, no carotid bruits. LUNGS: Clear to auscultation bilaterally, no wheezes. MUSCULOSKELETAL: No gross abnormalities. NEUROLOGICAL: Alert and oriented x3, cranial nerves II through XII intact. PSYCHIATRIC: Normal mood, good eye contact.       Assessment & Plan:  Elevated sed rate -     predniSONE ; Take 1 tablet (20 mg total) by mouth daily with breakfast.  Dispense: 30 tablet; Refill: 1 -     Sedimentation rate  Familial hyperlipidemia  Prediabetes  Vitamin D  deficiency  OSA on CPAP -     Tirzepatide ; Inject 2.5 mg into the skin once a week.  Dispense: 2 mL; Refill: 0  Other orders -     Famotidine ; Take 1 tablet (20 mg total) by mouth 2 (two) times daily.  Dispense: 180 tablet; Refill: 1    Assessment and Plan Assessment & Plan Familial hyperlipidemia   Managed with rosuvastatin , Nexlizet , and about to start Repatha . She reports constipation and dryness from rosuvastatin /nexlizet . Repatha  has not been started yet. The goal is to reduce cholesterol levels and potentially discontinue other medications once Repatha  is effective. Start  Repatha  injections every two weeks. Recheck cholesterol levels in one month. Continue rosuvastatin  and Nexlizet  until Repatha  is effective.  Polymyalgia rheumatica (suspected) and fibromyalgia   Suspected polymyalgia rheumatica with symptoms of body pain, weakness, and elevated sed rate (47). Improvement noted with prednisone , but symptoms persist. Differential includes fibromyalgia and osteoarthritis. Prednisone  is necessary . Rheumatology appointment is scheduled for February, but earlier input from sports medicine is anticipated. Continue prednisone  as prescribed. Ordered sed rate test to  monitor inflammation. Coordinate with rheumatology and sports medicine for further management.  Obstructive sleep apnea   Managed with CPAP. Zepbound is considered for weight loss and potential insurance coverage due to sleep apnea diagnosis. Discussed potential side effects of Zepbound, including constipation, nausea, and appetite suppression. Emphasized the importance of maintaining adequate protein intake to prevent protein deficiency. Continue CPAP therapy. Initiated Zepbound for weight loss, pending insurance approval. Monitor for side effects and adjust dosage as needed.  Overweight and weight management   Weight management is ongoing with phentermine, which was discontinued due to hypertension and eye symptoms. Zepbound is considered for weight loss, with emphasis on dietary changes and exercise. Discussed the importance of maintaining a balanced diet with adequate protein intake. Continue dietary modifications and exercise regimen. Consider resuming phentermine at half dose if tolerated. Initiated Zepbound for weight loss, pending insurance approval.  Constipation   Chronic constipation exacerbated by medications. Managed with dietary fiber, prunes, and herbal supplements. Potential for increased constipation with Zepbound. Discussed the use of Colace and Miralax as additional options if needed. Continue  current dietary fiber intake and herbal supplements. Consider Colace 100-200 mg and Miralax if constipation persists.  Prediabetes   A1c is 6.1, indicating prediabetes. Prednisone  may contribute to elevated blood sugar levels. Emphasis on dietary management and weight loss to prevent progression to diabetes. Continue monitoring blood sugar levels. Maintain dietary modifications to manage blood sugar.  Vitamin D  deficiency   B12 levels are adequate. Continue vitamin D  supplementation.     Return in about 4 weeks (around 09/08/2024) for chronic follow-up.  Jenkins CHRISTELLA Carrel, MD

## 2024-08-12 ENCOUNTER — Other Ambulatory Visit (HOSPITAL_COMMUNITY): Payer: Self-pay

## 2024-08-12 ENCOUNTER — Telehealth: Payer: Self-pay

## 2024-08-12 NOTE — Telephone Encounter (Signed)
 Pharmacy Patient Advocate Encounter   Received notification from Pt Calls Messages that prior authorization for Zepbound  2.5MG /0.5ML pen-injectors is required/requested.   Insurance verification completed.   The patient is insured through [facility] MEDICAID.   Per test claim: PA required; PA submitted to above mentioned insurance via Latent Key/confirmation #/EOC [id number] Status is pending

## 2024-08-12 NOTE — Telephone Encounter (Signed)
 Pharmacy Patient Advocate Encounter  Received notification from [facility] MEDICAID that Prior Authorization for Zepbound  2.5MG /0.5ML Auto-injectors has been DENIED.  Full denial letter will be uploaded to the media tab. See denial reason below.   PA #/Case ID/Reference #: [id number]

## 2024-08-15 ENCOUNTER — Other Ambulatory Visit: Payer: Self-pay

## 2024-08-16 NOTE — Progress Notes (Unsigned)
 Sherri Wolf [facility] [address] [place] [address] Phone: [phone] Subjective:   Sherri Wolf, am serving as a scribe for Dr. Arthea Wolf.  I'm seeing this patient by the request  of:  Sherri Jenkins Jansky, MD  CC: Left foot, neck and bilateral shoulder pain  YEP:Dlagzrupcz  Sherri Wolf is a 58 y.o. female coming in with complaint of L foot, neck and B shoulder pain. Patient states that pain radiates from neck into both shoulders. Does yoga and tries to move neck throughout the day. Neck is constantly stiff and painful. Also uses topical analgesics.   Also having lower back pain and stiffness in lumbar spine. Especially hard to perform lumbar flexion. Pain directly over spine starts to increase when she is walking. Has swelling of lower legs especially when standing for prolonged periods.   Also has numbness and tingling in both hands and feet. Has had cortisone injection in R hip and hands.     Recent laboratory workup by primary care shows significant low vitamin D .  Recently sent in the once weekly prescription dose by primary care.  Sedimentation rate was increased again to 47 2 weeks ago.  Patient has had it as high as 68 2 years ago.  Past Medical History:  Diagnosis Date   Acute non-recurrent maxillary sinusitis 09/08/2020   Bilateral carpal tunnel syndrome 10/28/2019   Colon polyp 10/27/2017   COVID-19 ruled out by laboratory testing 09/08/2020   Depression, recurrent 10/21/2022   Edema leg 12/04/2021   Essential hypertension 09/23/2015   Formatting of this note is different from the original.  HTN, Started in June 2018  Had dry cough with Lisinopril.  Changed to Toprol  XL 25 mg August 2018     Last Assessment & Plan:   Formatting of this note might be different from the original.  Assessment:   Adequately controlled on medical therapy.   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       Excessive  weight gain 06/06/2015   Last Assessment & Plan: Formatting of this note might be different from the original. A/P Cause of recent sudden weight gain is difficult to pin point. All lab w/u is unremarkable for systemic disease like HTN, DM, Hypothyroidism, renal disease (Nephrotic syndrome). Lupus and other connective tissue disease should be considered. Adv To have eval with internist and Rheumatologist.   Fibromyalgia 12/04/2021   Gastroesophageal reflux disease without esophagitis 12/04/2021   Hypercholesteremia 06/06/2015   Formatting of this note might be different from the original. Last Assessment & Plan: Assessment:  The condition is stable  No recent labs  Body mass index is 32.06 kg/m. Plan:  Continue dietary measures  Aim is to achieve LDL level  below 70  Continue regular exercise  Continue current medical management.  Check labs prior to next visit has a current medication list which includes the fo   Hyperlipidemia    Hypertension 2017   resolved   Left ovarian cyst 10/27/2017   Low back pain 06/06/2015   Formatting of this note might be different from the original. Last Assessment & Plan: S: Still has right sided low back pain shooting down the leg. Also has rt shoulder pain which limits her arm movements. A/P Rec: Ortho eval, to r/o spinal stenosis. Last Assessment & Plan: Formatting of this note might be different from the original. S: Still has right sided low back pain shooting  down the leg. A   Lower respiratory infection 09/08/2020   Migraine 10/27/2017   Formatting of this note might be different from the original. hx migraines, mri-mild periventricular white matter ischemic changes- 2018 June Formatting of this note might be different from the original. hx migraines, mri-mild periventricular white matter ischemic changes- 2018 June   Neck pain 10/28/2019   Numbness 10/28/2019   Obstructive sleep apnea (adult) (pediatric) 12/04/2021   Other chest pain 06/06/2015    Formatting of this note might be different from the original. 06/06/2015 EST echo 4 mins standard Bruce protocol. No CP Last Assessment & Plan: Formatting of this note might be different from the original. S: No cp. Still has shortness of breath while walking. A/P ReC: Pul evaluation, PFT,  to r/o asthma/ pul fibrosis.Echo is normal with no pulmonary HTN   Other specified postprocedural states 10/27/2017   Formatting of this note might be different from the original. hx of congential vaginal atresia   Pain in right knee 04/30/2018   Peripheral edema 01/07/2021   Persistent cough 03/02/2020   Prediabetes 07/01/2019   Primary osteoarthritis of both first carpometacarpal joints 10/28/2019   Pruritus 12/04/2021   Right ovarian cyst 10/27/2017   Trigger middle finger of left hand 10/28/2019   Vitamin D  deficiency 12/30/2019   Past Surgical History:  Procedure Laterality Date   TOTAL VAGINAL HYSTERECTOMY  2019   Social History   Socioeconomic History   Marital status: Married    Spouse name: Not on file   Number of children: 0   Years of education: Not on file   Highest education level: GED or equivalent  Occupational History   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    Step son and daughter.  Grands-2    Left handed    Caffeine: 1/2 cup of tea every morning    Social Drivers of Health   Financial Resource Strain: Medium Risk (07/29/2024)   Overall Financial Resource Strain (CARDIA)    Difficulty of Paying Living Expenses: Somewhat hard  Food Insecurity: No Food Insecurity (07/29/2024)   Hunger Vital  Sign    Worried About Running Out of Food in the Last Year: Never true    Ran Out of Food in the Last Year: Never true  Transportation Needs: No Transportation Needs (07/29/2024)   PRAPARE - Administrator, Civil Service (Medical): No    Lack of Transportation (Non-Medical): No  Physical Activity: Insufficiently Active (07/29/2024)   Exercise Vital Sign    Days of Exercise per Week: 3 days    Minutes of Exercise per Session: 30 min  Stress: Stress Concern Present (07/29/2024)   [employer] of Occupational Health - Occupational Stress Questionnaire    Feeling of Stress: To some extent  Social Connections: Socially Integrated (07/29/2024)   Social Connection and Isolation Panel    Frequency of Communication with Friends and Family: Twice a week    Frequency of Social Gatherings with Friends and Family: Once a week    Attends Religious Services: 1 to 4 times  per year    Active Member of Clubs or Organizations: Yes    Attends Banker Meetings: 1 to 4 times per year    Marital Status: Married   No Known Allergies Family History  Problem Relation Age of Onset   Hypertension Mother    Hyperlipidemia Father    Hypertension Father    Lung disease Father    Snoring Father    Colon cancer Neg Hx    Stomach cancer Neg Hx    Esophageal cancer Neg Hx     Current Outpatient Medications (Endocrine & Metabolic):    predniSONE  (DELTASONE ) 20 MG tablet, Take 1 tablet (20 mg total) by mouth daily with breakfast.  Current Outpatient Medications (Cardiovascular):    Bempedoic Acid-Ezetimibe (NEXLIZET ) 180-10 MG TABS, Take 1 tablet by mouth daily in the afternoon.   Evolocumab  (REPATHA  SURECLICK) 140 MG/ML SOAJ, Inject 140 mg into the skin every 14 (fourteen) days.   rosuvastatin  (CRESTOR ) 20 MG tablet, Take 1 tablet (20 mg total) by mouth daily.  Current Outpatient Medications (Respiratory):    azelastine  (ASTELIN ) 0.1 % nasal spray, Place 2 sprays into both nostrils  2 (two) times daily.   cetirizine  (ZYRTEC ) 10 MG tablet, Take 1 tablet (10 mg total) by mouth daily.  Current Outpatient Medications (Analgesics):    diclofenac  (VOLTAREN ) 75 MG EC tablet, Take 1 tablet (75 mg total) by mouth 2 (two) times daily.  Current Outpatient Medications (Hematological):    cyanocobalamin  (VITAMIN B12) 1000 MCG tablet, Take 1 tablet by mouth daily.  Current Outpatient Medications (Other):    azelastine  (OPTIVAR ) 0.05 % ophthalmic solution, Place 1 drop into both eyes 2 (two) times daily.   famotidine  (PEPCID ) 20 MG tablet, Take 1 tablet (20 mg total) by mouth 2 (two) times daily.   Multiple Vitamins-Minerals (ONCOVITE) TABS, Take 1 capsule by mouth daily.   OVER THE COUNTER MEDICATION, Pt taking vitamin D  1 a day   pantoprazole  (PROTONIX ) 20 MG tablet, TAKE 1 TABLET (20 MG TOTAL) BY MOUTH 2 (TWO) TIMES DAILY BEFORE A MEAL. TAKE 1 PILL TWICE A DAY FOR 2 WEEKS, THEN 1 PILL EVERY MORNING.   phentermine  (ADIPEX-P ) 37.5 MG tablet, Take half tablet daily  PO prior to breakfast.After a week, may increase to full tablet in needed   tirzepatide  (ZEPBOUND ) 2.5 MG/0.5ML Pen, Inject 2.5 mg into the skin once a week.   Vitamin D , Ergocalciferol , (DRISDOL ) 1.25 MG (50000 UNIT) CAPS capsule, Take 1 capsule (50,000 Units total) by mouth every 7 (seven) days.   Reviewed prior external information including notes and imaging from  primary care provider As well as notes that were available from care everywhere and other healthcare systems.  Past medical history, social, surgical and family history all reviewed in electronic medical record.  No pertanent information unless stated regarding to the chief complaint.   Review of Systems:  No headache, visual changes, nausea, vomiting, diarrhea, constipation, dizziness, abdominal pain, skin rash, fevers, chills, night sweats, weight loss, swollen lymph nodes, body aches, joint swelling, chest pain, shortness of breath, mood changes.  POSITIVE muscle aches, joint swelling, body aches.  Family history is significant for psoriatic arthritis in sister as well as dad died of a young age secondary to some vascular compromise.  Objective  Blood pressure (!) 100/56, pulse 81, height 5' 3 (1.6 m), weight 183 lb (83 kg), SpO2 98%.   General: No apparent distress alert and oriented x3 mood and affect normal, dressed appropriately.  HEENT: Pupils equal,  extraocular movements intact  Respiratory: Patient's speak in full sentences and does not appear short of breath  Cardiovascular: No lower extremity edema, non tender, no erythema  Low back does have some loss lordosis.  Patient is sitting relatively comfortably.  Seems to be neurovascularly intact distally.  Able to go from a seated to standing position without any significant difficulty.    Impression and Recommendations:     The above documentation has been reviewed and is accurate and complete Sherri Wolf M Lekendrick Alpern, DO

## 2024-08-17 ENCOUNTER — Ambulatory Visit: Admitting: Family Medicine

## 2024-08-17 ENCOUNTER — Other Ambulatory Visit: Payer: Self-pay

## 2024-08-17 ENCOUNTER — Ambulatory Visit: Payer: Self-pay | Admitting: Family Medicine

## 2024-08-17 VITALS — BP 100/56 | HR 81 | Ht 63.0 in | Wt 183.0 lb

## 2024-08-17 DIAGNOSIS — M545 Low back pain, unspecified: Secondary | ICD-10-CM | POA: Diagnosis not present

## 2024-08-17 DIAGNOSIS — R202 Paresthesia of skin: Secondary | ICD-10-CM | POA: Diagnosis not present

## 2024-08-17 DIAGNOSIS — R5383 Other fatigue: Secondary | ICD-10-CM | POA: Diagnosis not present

## 2024-08-17 DIAGNOSIS — E559 Vitamin D deficiency, unspecified: Secondary | ICD-10-CM | POA: Diagnosis not present

## 2024-08-17 DIAGNOSIS — G8929 Other chronic pain: Secondary | ICD-10-CM | POA: Diagnosis not present

## 2024-08-17 DIAGNOSIS — R700 Elevated erythrocyte sedimentation rate: Secondary | ICD-10-CM | POA: Diagnosis not present

## 2024-08-17 DIAGNOSIS — M79672 Pain in left foot: Secondary | ICD-10-CM

## 2024-08-17 LAB — COMPREHENSIVE METABOLIC PANEL WITH GFR
ALT: 24 U/L (ref 0–35)
AST: 17 U/L (ref 0–37)
Albumin: 4.1 g/dL (ref 3.5–5.2)
Alkaline Phosphatase: 65 U/L (ref 39–117)
BUN: 10 mg/dL (ref 6–23)
CO2: 29 meq/L (ref 19–32)
Calcium: 9.3 mg/dL (ref 8.4–10.5)
Chloride: 104 meq/L (ref 96–112)
Creatinine, Ser: 0.76 mg/dL (ref 0.40–1.20)
GFR: 86.84 mL/min (ref >60.00)
Glucose, Bld: 98 mg/dL (ref 70–99)
Potassium: 3.7 meq/L (ref 3.5–5.1)
Sodium: 141 meq/L (ref 135–145)
Total Bilirubin: 0.4 mg/dL (ref 0.2–1.2)
Total Protein: 7.1 g/dL (ref 6.0–8.3)

## 2024-08-17 LAB — CBC WITH DIFFERENTIAL/PLATELET
Basophils Absolute: 0.1 K/uL (ref 0.0–0.1)
Basophils Relative: 0.5 % (ref 0.0–3.0)
Eosinophils Absolute: 0.1 K/uL (ref 0.0–0.7)
Eosinophils Relative: 1.2 % (ref 0.0–5.0)
HCT: 34.1 % — ABNORMAL LOW (ref 36.0–46.0)
Hemoglobin: 11.7 g/dL — ABNORMAL LOW (ref 12.0–15.0)
Lymphocytes Relative: 31.1 % (ref 12.0–46.0)
Lymphs Abs: 3.4 K/uL (ref 0.7–4.0)
MCHC: 34.4 g/dL (ref 30.0–36.0)
MCV: 80.6 fl (ref 78.0–100.0)
Monocytes Absolute: 0.9 K/uL (ref 0.1–1.0)
Monocytes Relative: 7.8 % (ref 3.0–12.0)
Neutro Abs: 6.5 K/uL (ref 1.4–7.7)
Neutrophils Relative %: 59.4 % (ref 43.0–77.0)
Platelets: 357 K/uL (ref 150.0–400.0)
RBC: 4.23 Mil/uL (ref 3.87–5.11)
RDW: 13.4 % (ref 11.5–15.5)
WBC: 10.9 K/uL — ABNORMAL HIGH (ref 4.0–10.5)

## 2024-08-17 LAB — VITAMIN B12: Vitamin B-12: 906 pg/mL (ref 211–911)

## 2024-08-17 LAB — IBC PANEL
Iron: 76 ug/dL (ref 42–145)
Saturation Ratios: 19.7 % — ABNORMAL LOW (ref 20.0–50.0)
TIBC: 386.4 ug/dL (ref 250.0–450.0)
Transferrin: 276 mg/dL (ref 212.0–360.0)

## 2024-08-17 LAB — URIC ACID: Uric Acid, Serum: 6.2 mg/dL (ref 2.4–7.0)

## 2024-08-17 LAB — VITAMIN D 25 HYDROXY (VIT D DEFICIENCY, FRACTURES): VITD: 18.68 ng/mL — ABNORMAL LOW (ref 30.00–100.00)

## 2024-08-17 LAB — TSH: TSH: 0.78 u[IU]/mL (ref 0.35–5.50)

## 2024-08-17 LAB — C-REACTIVE PROTEIN: CRP: <0.5 mg/dL (ref 0.5–20.0)

## 2024-08-17 LAB — SEDIMENTATION RATE: Sed Rate: 27 mm/h (ref 0–30)

## 2024-08-17 LAB — FERRITIN: Ferritin: 33.5 ng/mL (ref 10.0–291.0)

## 2024-08-17 NOTE — Assessment & Plan Note (Addendum)
 Multifactorial.  Significant difficulty over the course of time.  Still having pain with some intermittent radicular symptoms.  Discussed with patient about icing regimen and home exercises, discussed which activities to do and which ones to avoid.  Increase activity slowly.  Follow-up with me again in 6 to 12 weeks otherwise.  Will get laboratory workup with patient having elevated sedimentation rate previously.

## 2024-08-17 NOTE — Patient Instructions (Addendum)
 Labs today We will make changes through MyChart See me in 2-3 months

## 2024-08-22 LAB — HLA-B27 ANTIGEN: HLA-B27 Antigen: NEGATIVE

## 2024-08-22 LAB — ANGIOTENSIN CONVERTING ENZYME: Angiotensin-Converting Enzyme: 23 U/L (ref 9–67)

## 2024-08-22 LAB — RHEUMATOID FACTOR: Rheumatoid fact SerPl-aCnc: <10 [IU]/mL (ref <14)

## 2024-08-22 LAB — ANA: Anti Nuclear Antibody (ANA): NEGATIVE

## 2024-08-22 LAB — CALCIUM, IONIZED: Calcium, Ion: 5.1 mg/dL (ref 4.7–5.5)

## 2024-08-22 LAB — PTH, INTACT AND CALCIUM
Calcium: 9.2 mg/dL (ref 8.6–10.4)
PTH: 42 pg/mL (ref 16–77)

## 2024-08-22 LAB — SJOGRENS SYNDROME-A EXTRACTABLE NUCLEAR ANTIBODY: SSA (Ro) (ENA) Antibody, IgG: <1 AI

## 2024-08-22 LAB — CYCLIC CITRUL PEPTIDE ANTIBODY, IGG: Cyclic Citrullin Peptide Ab: <16 U

## 2024-08-22 LAB — SJOGRENS SYNDROME-B EXTRACTABLE NUCLEAR ANTIBODY: SSB (La) (ENA) Antibody, IgG: <1 AI

## 2024-08-24 LAB — ANTI-TPO AB (RDL): Anti-TPO Ab (RDL): <9 [IU]/mL (ref <9.0)

## 2024-09-05 ENCOUNTER — Other Ambulatory Visit: Payer: Self-pay

## 2024-09-05 ENCOUNTER — Ambulatory Visit: Admitting: Internal Medicine

## 2024-09-05 VITALS — BP 118/74 | HR 94 | Temp 98.1°F | Resp 18 | Ht 64.0 in | Wt 182.6 lb

## 2024-09-05 DIAGNOSIS — R700 Elevated erythrocyte sedimentation rate: Secondary | ICD-10-CM | POA: Diagnosis not present

## 2024-09-05 DIAGNOSIS — G8929 Other chronic pain: Secondary | ICD-10-CM

## 2024-09-05 DIAGNOSIS — L503 Dermatographic urticaria: Secondary | ICD-10-CM | POA: Diagnosis not present

## 2024-09-05 DIAGNOSIS — R140 Abdominal distension (gaseous): Secondary | ICD-10-CM

## 2024-09-05 DIAGNOSIS — M25511 Pain in right shoulder: Secondary | ICD-10-CM | POA: Diagnosis not present

## 2024-09-05 DIAGNOSIS — M797 Fibromyalgia: Secondary | ICD-10-CM

## 2024-09-05 NOTE — Patient Instructions (Addendum)
 Evaluation for food allergy  in the context of abdominal pain, bloating, and constipation - Skin testing (05/09/24): positive to rye,but negative to all other foods - Avoid foods that trigger bloating  - Continue plan by GI   Shoulder pain/Fibromyalgia  -  limit prednisone  due to side effects  - Can take ibuprofen 800mg  every 8 hours  - Can take tylenol 650mg  every 6 hours  - Agree with rheumatology evaluation given pain/ESR both improves with prednisone   - Discuss rotator cuff injury with PCP   Chronic pruritus with rash - .Continue  zyrtec  (cetirizine ) 10mg  daily, can increase to twice daily as needed  - Continue Pepcid  20mg  daily, can increase to twice daily as needed  - Take least amount of medications to keep itch/hive free    Follow up: 6 months    Thank you so much for letting me partake in your care today.  Don't hesitate to reach out if you have any additional concerns!

## 2024-09-05 NOTE — Progress Notes (Signed)
 FOLLOW UP Date of Service/Encounter:  09/05/2024  Subjective:  Sherri Wolf (DOB: Nov 27, 1965) is a 58 y.o. female who [facility] in re-evaluation of the following: itch, abdominal pain  History obtained from: chart review and patient.  For Review, LV was on 06/06/24  with Dr. Lorin seen for routine follow-up. See below for summary of history and diagnostics.   Therapeutic plans/changes recommended: started zyrtec /pepcid    Pertinent History/Diagnostics:  Food Intolerance:  Chronic abdominal pain, bloating, and constipation triggered by foods like milk, rice, wheat flour, salad, and fruit. Symptoms include burping and pain lasting up to two days.  - SPT select foods (05/09/24): positive to rye, negative to all other foods  Chronic Pruritus  Chronic pruritus improved by reduced salt intake. Itching exacerbated by sugar and salt intake. Hydroxyzine  provides relief for itching but not swelling. - controlled on zyrtec  BID, pepcid  BID  Other: Persistent lower extremity edema likely due to venous insufficiency, exacerbated by prolonged standing and relieved by leg elevation. Compression stockings previously caused discomfort Today presents for follow-up. Discussed the use of AI scribe software for clinical note transcription with the patient, who gave verbal consent to proceed.  History of Present Illness Sherri Wolf is a 58 year old female with fibromyalgia who presents with persistent numbness in her hands and allergy  symptoms. She was referred by her primary care physician for evaluation of her fibromyalgia and numbness in her hands.  Hand numbness - Persistent numbness in both hands - Currently under investigation - Blood work negative for autoimmune disease - Slightly elevated uric acid levels - Rheumatology appointment scheduled for January   Fibromyalgia symptoms - Worsening pain and swelling in hands - Pain described as severe and highly  sensitive to touch ('please don't touch me') with current severe flare in right shoulder  - Significant impact on daily activities - Prednisone  initiated at 20 mg, reduced to 10 mg - Prednisone  improved ESR and pain but increased appetite  Chronic shoulder pain - Chronic pain in right shoulder, worsened by certain movements - Pain radiates down the arm - Constant and severe pain -affecting some ADLs  - Tylenol and ibuprofen provide only temporary relief, prednisone  helps but causes side effects   Pruritus - Chronic itching - Managed with daily Femininide and Cetirizine  - Cessation of medication leads to recurrence of itching  Allergy  symptoms - Persistent allergy  symptoms  Appetite and abdominal symptoms - Increased appetite attributed to prednisone  use - Abdominal symptoms improved with daily sesame oil massages  Hyperlipidemia and weight management - Elevated cholesterol managed with Repatha  injections - Mounjaro  prescribed but not started due to insurance issues - Phentermine  trialed for weight management, discontinued due to elevated blood pressure and sensation of 'heatness' in eyes     All medications reviewed by clinical staff and updated in chart. No new pertinent medical or surgical history except as noted in HPI.  ROS: All others negative except as noted per HPI.   Objective:  BP 118/74 (BP Location: Right Arm, Patient Position: Sitting, Cuff Size: Normal)   Pulse 94   Temp 98.1 F (36.7 C) (Temporal)   Resp 18   Ht 5' 4 (1.626 m)   Wt 182 lb 9.6 oz (82.8 kg)   SpO2 99%   BMI 31.34 kg/m  Body mass index is 31.34 kg/m. Physical Exam: General Appearance:  Alert, cooperative, no distress, appears stated age  Head:  Normocephalic, without obvious abnormality, atraumatic  Eyes:  Conjunctiva clear,  EOM's intact  Ears EACs normal bilaterally  Nose: Nares normal, no rhinorrhea   Throat: Lips, tongue normal; teeth and gums normal, MMM  Neck: Supple,  symmetrical  Lungs:    , Respirations unlabored, no coughing  Heart:   , Appears well perfused  Extremities: No edema, positive empty can test, pain with palpitation on right shoulder   Skin: Skin color, texture, turgor normal and no rashes or lesions on visualized portions of skin  Neurologic: No gross deficits   Labs:  Lab Orders  No laboratory test(s) ordered today    Assessment/Plan   Patient Instructions  Evaluation for food allergy  in the context of abdominal pain, bloating, and constipation - Skin testing (05/09/24): positive to rye,but negative to all other foods - Avoid foods that trigger bloating  - Continue plan by GI   Shoulder pain/Fibromyalgia  -  limit prednisone  due to side effects  - Can take ibuprofen 800mg  every 8 hours  - Can take tylenol 650mg  every 6 hours  - Agree with rheumatology evaluation given pain/ESR both improves with prednisone   - Discuss rotator cuff injury with PCP   Chronic pruritus with rash - .Continue  zyrtec  (cetirizine ) 10mg  daily, can increase to twice daily as needed  - Continue Pepcid  20mg  daily, can increase to twice daily as needed  - Take least amount of medications to keep itch/hive free    Follow up: 6 months    Thank you so much for letting me partake in your care today.  Don't hesitate to reach out if you have any additional concerns!  Other:    Thank you so much for letting me partake in your care today.  Don't hesitate to reach out if you have any additional concerns!  Hargis Springer, MD  [facility]- [place], [place]

## 2024-09-08 ENCOUNTER — Ambulatory Visit: Admitting: Family Medicine

## 2024-09-08 ENCOUNTER — Encounter: Payer: Self-pay | Admitting: Family Medicine

## 2024-09-08 VITALS — BP 124/78 | HR 82 | Temp 97.3°F | Ht 64.0 in | Wt 183.2 lb

## 2024-09-08 DIAGNOSIS — E7849 Other hyperlipidemia: Secondary | ICD-10-CM | POA: Diagnosis not present

## 2024-09-08 DIAGNOSIS — R700 Elevated erythrocyte sedimentation rate: Secondary | ICD-10-CM

## 2024-09-08 LAB — CBC WITH DIFFERENTIAL/PLATELET
Basophils Absolute: 0.1 K/uL (ref 0.0–0.1)
Basophils Relative: 0.7 % (ref 0.0–3.0)
Eosinophils Absolute: 0.2 K/uL (ref 0.0–0.7)
Eosinophils Relative: 1.8 % (ref 0.0–5.0)
HCT: 34 % — ABNORMAL LOW (ref 36.0–46.0)
Hemoglobin: 11.9 g/dL — ABNORMAL LOW (ref 12.0–15.0)
Lymphocytes Relative: 25.5 % (ref 12.0–46.0)
Lymphs Abs: 2.3 K/uL (ref 0.7–4.0)
MCHC: 35.1 g/dL (ref 30.0–36.0)
MCV: 81.4 fl (ref 78.0–100.0)
Monocytes Absolute: 0.6 K/uL (ref 0.1–1.0)
Monocytes Relative: 7.3 % (ref 3.0–12.0)
Neutro Abs: 5.7 K/uL (ref 1.4–7.7)
Neutrophils Relative %: 64.7 % (ref 43.0–77.0)
Platelets: 429 K/uL — ABNORMAL HIGH (ref 150.0–400.0)
RBC: 4.18 Mil/uL (ref 3.87–5.11)
RDW: 13.4 % (ref 11.5–15.5)
WBC: 8.8 K/uL (ref 4.0–10.5)

## 2024-09-08 LAB — LIPID PANEL
Cholesterol: 126 mg/dL (ref 28–200)
HDL: 50.9 mg/dL (ref >39.00)
LDL Cholesterol: 39 mg/dL (ref 10–99)
NonHDL: 74.76
Total CHOL/HDL Ratio: 2
Triglycerides: 179 mg/dL — ABNORMAL HIGH (ref 10.0–149.0)
VLDL: 35.8 mg/dL (ref 0.0–40.0)

## 2024-09-08 LAB — SEDIMENTATION RATE: Sed Rate: 42 mm/h — ABNORMAL HIGH (ref 0–30)

## 2024-09-08 LAB — C-REACTIVE PROTEIN: CRP: 0.7 mg/dL — ABNORMAL LOW (ref 1.0–20.0)

## 2024-09-08 NOTE — Progress Notes (Signed)
 Subjective:     Patient ID: Sherri Wolf, female    DOB: 1965/09/26, 58 y.o.   MRN: [id number]  Chief Complaint  Patient presents with   Hyperlipidemia    Pt is here for chronic issues    Discussed the use of AI scribe software for clinical note transcription with the patient, who gave verbal consent to proceed.  History of Present Illness Sherri Wolf is a 58 year old female with hyperlipidemia and sleep apnea who presents with shoulder pain and medication management.  She experiences significant R shoulder pain that persists both day and night, describing it as 'so much pain' and 'killing me.' The pain sometimes eases but can be sharp with certain movements, particularly when raising her arm. No recent falls or injuries, but the pain is exacerbated by touch and massage. She has been using heat therapy and Tylenol, which provides minimal relief.  She manages her hyperlipidemia with Repatha , taken every two weeks, and rosuvastatin  20 mg daily. She recently ran out of Nexelazep three days ago, which she was taking every night. She is fasting today for a blood check to assess her cholesterol levels.  She is on prednisone , taking a half pill daily of the 20mg , which has increased her appetite significantly, leading to weight gain. She has previously tried phentermine  but stopped due to side effects. She is concerned about her weight and is working on diet and exercise, including yoga and home workouts with her husband.  She has a history of sleep apnea, which is not currently being managed with medication due to insurance coverage issues.  She reports that her hemoglobin and hematocrit levels have been low, and that her white blood cell count was noted to be a little high in her recent labs. No heavy periods or bleeding. Her uric acid levels are slightly elevated.    There are no preventive care reminders to display for this patient.  Past Medical History:  Diagnosis Date   Acute  non-recurrent maxillary sinusitis 09/08/2020   Bilateral carpal tunnel syndrome 10/28/2019   Colon polyp 10/27/2017   COVID-19 ruled out by laboratory testing 09/08/2020   Depression, recurrent 10/21/2022   Edema leg 12/04/2021   Essential hypertension 09/23/2015   Formatting of this note is different from the original.  HTN, Started in June 2018  Had dry cough with Lisinopril.  Changed to Toprol  XL 25 mg August 2018     Last Assessment & Plan:   Formatting of this note might be different from the original.  Assessment:   Adequately controlled on medical therapy.   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       Excessive weight gain 06/06/2015   Last Assessment & Plan: Formatting of this note might be different from the original. A/P Cause of recent sudden weight gain is difficult to pin point. All lab w/u is unremarkable for systemic disease like HTN, DM, Hypothyroidism, renal disease (Nephrotic syndrome). Lupus and other connective tissue disease should be considered. Adv To have eval with internist and Rheumatologist.   Fibromyalgia 12/04/2021   Gastroesophageal reflux disease without esophagitis 12/04/2021   Hypercholesteremia 06/06/2015   Formatting of this note might be different from the original. Last Assessment & Plan: Assessment:  The condition is stable  No recent labs  Body mass index is 32.06 kg/m. Plan:  Continue dietary measures  Aim is to achieve LDL level  below 70  Continue regular exercise  Continue current medical management.  Check labs prior to next visit has a current medication list which includes the fo   Hyperlipidemia    Hypertension 2017   resolved   Left ovarian cyst 10/27/2017   Low back pain 06/06/2015   Formatting of this note might be different from the original. Last Assessment & Plan: S: Still has right sided low back pain shooting down the leg. Also has rt shoulder pain which limits her arm movements. A/P  Rec: Ortho eval, to r/o spinal stenosis. Last Assessment & Plan: Formatting of this note might be different from the original. S: Still has right sided low back pain shooting down the leg. A   Lower respiratory infection 09/08/2020   Migraine 10/27/2017   Formatting of this note might be different from the original. hx migraines, mri-mild periventricular white matter ischemic changes- 2018 June Formatting of this note might be different from the original. hx migraines, mri-mild periventricular white matter ischemic changes- 2018 June   Neck pain 10/28/2019   Numbness 10/28/2019   Obstructive sleep apnea (adult) (pediatric) 12/04/2021   Other chest pain 06/06/2015   Formatting of this note might be different from the original. 06/06/2015 EST echo 4 mins standard Bruce protocol. No CP Last Assessment & Plan: Formatting of this note might be different from the original. S: No cp. Still has shortness of breath while walking. A/P ReC: Pul evaluation, PFT,  to r/o asthma/ pul fibrosis.Echo is normal with no pulmonary HTN   Other specified postprocedural states 10/27/2017   Formatting of this note might be different from the original. hx of congential vaginal atresia   Pain in right knee 04/30/2018   Peripheral edema 01/07/2021   Persistent cough 03/02/2020   Prediabetes 07/01/2019   Primary osteoarthritis of both first carpometacarpal joints 10/28/2019   Pruritus 12/04/2021   Right ovarian cyst 10/27/2017   Trigger middle finger of left hand 10/28/2019   Vitamin D  deficiency 12/30/2019    Past Surgical History:  Procedure Laterality Date   TOTAL VAGINAL HYSTERECTOMY  2019    Current Medications[1]  Allergies[2] ROS neg/noncontributory except as noted HPI/below      Objective:     BP 124/78 (BP Location: Left Arm, Patient Position: Sitting, Cuff Size: Normal)   Pulse 82   Temp (!) 97.3 F (36.3 C) (Temporal)   Ht 5' 4 (1.626 m)   Wt 183 lb 4 oz (83.1 kg)   SpO2 98%   BMI 31.45  kg/m  Wt Readings from Last 3 Encounters:  09/08/24 183 lb 4 oz (83.1 kg)  09/05/24 182 lb 9.6 oz (82.8 kg)  08/17/24 183 lb (83 kg)    Physical Exam GENERAL: Well developed, well nourished, no acute distress. HEAD EYES EARS NOSE THROAT: Normocephalic, atraumatic, conjunctiva not injected, sclera nonicteric. CARDIAC: Regular rate and rhythm, S1 S2 present, no murmur EXTREMITIES: No edema, no swelling in extremities. MUSCULOSKELETAL: No gross abnormalities, shoulder strength normal. MS 5/5 all 4.  R posterior neck to shoulder blade-tender, tight. NEUROLOGICAL: Alert and oriented x3, cranial nerves II through XII intact. PSYCHIATRIC: Normal mood, good eye contact.       Assessment & Plan:  Familial hyperlipidemia -     Lipid panel  Elevated sed rate -     CBC with Differential/Platelet -     Sedimentation rate -     C-reactive protein    Assessment and Plan Assessment & Plan Polymyalgia rheumatica   presumed.  Managed with prednisone  10 mg daily, which  has improved overall aches and weakness. Concerns about weight gain due to increased appetite from prednisone . Insurance issues with weight management medications due to lack of coverage for Zepbound . Continue prednisone  10 mg daily. Encourage dietary modifications and increased physical activity. Coordinated with referral team to address insurance coverage for weight management medications for OSA.  Right shoulder pain   Severe right shoulder pain with limited range of motion, possibly due to trauma or overuse. Pain is persistent and not relieved by Tylenol or heat. Differential includes possible joint inflammation or injury. Referred to Dr. Claudene for evaluation and possible injection. Advised use of Voltaren  gel for anti-inflammatory effect. Encourage continued use of heat therapy.  Familial hyperlipidemia   Managed with rosuvastatin  and Repatha . Recent initiation of Repatha  with plans to discontinue the other medication that was  run out. Fasting blood work scheduled to assess lipid levels. Continue rosuvastatin  20 mg daily. Continue Repatha  as scheduled. Performed fasting blood work to assess lipid levels.  Obesity   Management complicated by increased appetite due to prednisone  use. Insurance issues with weight management medications. Current lack of coverage for zepbound , but potential future coverage due to sleep apnea diagnosis. Encourage dietary modifications and increased physical activity. Coordinated with referral team to address insurance coverage for weight management medications.  Obstructive sleep apnea   Potential for future coverage of weight management medications due to sleep apnea diagnosis. Coordinated with referral team to address insurance coverage for weight management medications.  Anemia   Mild anemia with low hemoglobin and hematocrit levels. No heavy periods or bleeding reported. Likely related to underlying autoimmune condition. Continue to monitor hemoglobin and hematocrit levels.  General health maintenance   Routine health maintenance discussed, including vitamin D  supplementation and recent blood work results. Continue vitamin D  supplementation.     Return in about 6 weeks (around 10/20/2024) for chronic follow-up.  Jenkins CHRISTELLA Carrel, MD     [1]  Current Outpatient Medications:    azelastine  (ASTELIN ) 0.1 % nasal spray, Place 2 sprays into both nostrils 2 (two) times daily., Disp: 30 mL, Rfl: 1   azelastine  (OPTIVAR ) 0.05 % ophthalmic solution, Place 1 drop into both eyes 2 (two) times daily., Disp: 6 mL, Rfl: 0   Bempedoic Acid-Ezetimibe (NEXLIZET ) 180-10 MG TABS, Take 1 tablet by mouth daily in the afternoon., Disp: 90 tablet, Rfl: 3   cetirizine  (ZYRTEC ) 10 MG tablet, Take 1 tablet (10 mg total) by mouth daily., Disp: 90 tablet, Rfl: 0   cyanocobalamin  (VITAMIN B12) 1000 MCG tablet, Take 1 tablet by mouth daily., Disp: , Rfl:    diclofenac  (VOLTAREN ) 75 MG EC tablet, Take 1 tablet (75  mg total) by mouth 2 (two) times daily., Disp: 30 tablet, Rfl: 0   Evolocumab  (REPATHA  SURECLICK) 140 MG/ML SOAJ, Inject 140 mg into the skin every 14 (fourteen) days., Disp: 2 mL, Rfl: 2   Multiple Vitamins-Minerals (ONCOVITE) TABS, Take 1 capsule by mouth daily., Disp: , Rfl:    OVER THE COUNTER MEDICATION, Pt taking vitamin D  1 a day, Disp: , Rfl:    pantoprazole  (PROTONIX ) 20 MG tablet, TAKE 1 TABLET (20 MG TOTAL) BY MOUTH 2 (TWO) TIMES DAILY BEFORE A MEAL. TAKE 1 PILL TWICE A DAY FOR 2 WEEKS, THEN 1 PILL EVERY MORNING., Disp: 180 tablet, Rfl: 0   predniSONE  (DELTASONE ) 20 MG tablet, Take 1 tablet (20 mg total) by mouth daily with breakfast., Disp: 30 tablet, Rfl: 1   rosuvastatin  (CRESTOR ) 20 MG tablet, Take 1 tablet (20 mg total)  by mouth daily., Disp: 90 tablet, Rfl: 1   Vitamin D , Ergocalciferol , (DRISDOL ) 1.25 MG (50000 UNIT) CAPS capsule, Take 1 capsule (50,000 Units total) by mouth every 7 (seven) days., Disp: 12 capsule, Rfl: 1 [2] No Known Allergies

## 2024-09-08 NOTE — Patient Instructions (Signed)
 Happy Holidays!!!!

## 2024-09-11 ENCOUNTER — Ambulatory Visit: Payer: Self-pay | Admitting: Family Medicine

## 2024-09-11 MED ORDER — PREDNISONE 5 MG PO TABS: 2.5000 mg | ORAL_TABLET | Freq: Every day | ORAL | 1 refills | Status: AC

## 2024-09-11 NOTE — Progress Notes (Signed)
"   Cholesterol much better.  Stay on repatha  and can try 1/2 tabs of rosuvastatin  Sed rate has increased- I sent in more prednisone  so to clarify-take 1/2 tab of the 20mg  and 1/2 tab of the new 5mg  daily(12.5mg  total per day) "

## 2024-09-12 NOTE — Progress Notes (Signed)
 Pt has read results [facility]

## 2024-09-14 ENCOUNTER — Ambulatory Visit: Admitting: Family Medicine

## 2024-10-13 NOTE — Progress Notes (Unsigned)
 " [facility] [address] [place] [address] Phone: [phone] Subjective:    I'm seeing this patient by the request  of:  Sherri Jenkins Jansky, MD  CC:   YEP:Dlagzrupcz  08/17/2024 Multifactorial.  Significant difficulty over the course of time.  Still having pain with some intermittent radicular symptoms.  Discussed with patient about icing regimen and home exercises, discussed which activities to do and which ones to avoid.  Increase activity slowly.  Follow-up with me again in 6 to 12 weeks otherwise.  Will get laboratory workup with patient having elevated sedimentation rate previously.     Update 10/17/2024 Sherri Wolf is a 59 y.o. female coming in with complaint of lumbar spine pain. Patient states        Past Medical History:  Diagnosis Date   Acute non-recurrent maxillary sinusitis 09/08/2020   Bilateral carpal tunnel syndrome 10/28/2019   Colon polyp 10/27/2017   COVID-19 ruled out by laboratory testing 09/08/2020   Depression, recurrent 10/21/2022   Edema leg 12/04/2021   Essential hypertension 09/23/2015   Formatting of this note is different from the original.  HTN, Started in June 2018  Had dry cough with Lisinopril.  Changed to Toprol  XL 25 mg August 2018     Last Assessment & Plan:   Formatting of this note might be different from the original.  Assessment:   Adequately controlled on medical therapy.   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       Excessive weight gain 06/06/2015   Last Assessment & Plan: Formatting of this note might be different from the original. A/P Cause of recent sudden weight gain is difficult to pin point. All lab w/u is unremarkable for systemic disease like HTN, DM, Hypothyroidism, renal disease (Nephrotic syndrome). Lupus and other connective tissue disease should be considered. Adv To have eval with internist and Rheumatologist.   Fibromyalgia 12/04/2021    Gastroesophageal reflux disease without esophagitis 12/04/2021   Hypercholesteremia 06/06/2015   Formatting of this note might be different from the original. Last Assessment & Plan: Assessment:  The condition is stable  No recent labs  Body mass index is 32.06 kg/m. Plan:  Continue dietary measures  Aim is to achieve LDL level  below 70  Continue regular exercise  Continue current medical management.  Check labs prior to next visit has a current medication list which includes the fo   Hyperlipidemia    Hypertension 2017   resolved   Left ovarian cyst 10/27/2017   Low back pain 06/06/2015   Formatting of this note might be different from the original. Last Assessment & Plan: S: Still has right sided low back pain shooting down the leg. Also has rt shoulder pain which limits her arm movements. A/P Rec: Ortho eval, to r/o spinal stenosis. Last Assessment & Plan: Formatting of this note might be different from the original. S: Still has right sided low back pain shooting down the leg. A   Lower respiratory infection 09/08/2020   Migraine 10/27/2017   Formatting of this note might be different from the original. hx migraines, mri-mild periventricular white matter ischemic changes- 2018 June Formatting of this note might be different from the original. hx migraines, mri-mild periventricular white matter ischemic changes- 2018 June   Neck pain 10/28/2019   Numbness 10/28/2019   Obstructive sleep apnea (adult) (pediatric) 12/04/2021   Other chest pain 06/06/2015   Formatting  of this note might be different from the original. 06/06/2015 EST echo 4 mins standard Bruce protocol. No CP Last Assessment & Plan: Formatting of this note might be different from the original. S: No cp. Still has shortness of breath while walking. A/P ReC: Pul evaluation, PFT,  to r/o asthma/ pul fibrosis.Echo is normal with no pulmonary HTN   Other specified postprocedural states 10/27/2017   Formatting of this note might  be different from the original. hx of congential vaginal atresia   Pain in right knee 04/30/2018   Peripheral edema 01/07/2021   Persistent cough 03/02/2020   Prediabetes 07/01/2019   Primary osteoarthritis of both first carpometacarpal joints 10/28/2019   Pruritus 12/04/2021   Right ovarian cyst 10/27/2017   Trigger middle finger of left hand 10/28/2019   Vitamin D  deficiency 12/30/2019   Past Surgical History:  Procedure Laterality Date   TOTAL VAGINAL HYSTERECTOMY  2019   Social History   Socioeconomic History   Marital status: Married    Spouse name: Not on file   Number of children: 0   Years of education: Not on file   Highest education level: GED or equivalent  Occupational History   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    Step son and daughter.  Grands-2    Left handed    Caffeine: 1/2 cup of tea every morning    Social Drivers of Health   Tobacco Use: Low Risk (09/08/2024)   Patient History    Smoking Tobacco Use: Never    Smokeless Tobacco Use: Never    Passive Exposure: Not on file  Financial Resource Strain: Medium Risk (07/29/2024)   Overall Financial Resource Strain (CARDIA)    Difficulty of Paying Living Expenses: Somewhat hard  Food Insecurity: No Food Insecurity (07/29/2024)   Epic    Worried About Programme Researcher, Broadcasting/film/video in the Last Year: Never true    Ran Out of Food in the Last Year: Never true  Transportation Needs: No Transportation Needs (07/29/2024)   Epic    Lack of Transportation (Medical): No    Lack of Transportation (Non-Medical): No  Physical Activity:  Insufficiently Active (07/29/2024)   Exercise Vital Sign    Days of Exercise per Week: 3 days    Minutes of Exercise per Session: 30 min  Stress: Stress Concern Present (07/29/2024)   [employer] of Occupational Health - Occupational Stress Questionnaire    Feeling of Stress: To some extent  Social Connections: Socially Integrated (07/29/2024)   Social Connection and Isolation Panel    Frequency of Communication with Friends and Family: Twice a week    Frequency of Social Gatherings with Friends and Family: Once a week    Attends Religious Services: 1 to 4 times per year    Active Member of Clubs or Organizations: Yes    Attends Banker Meetings: 1 to 4 times per year    Marital Status: Married  Depression (PHQ2-9): Low Risk (07/22/2024)   Depression (PHQ2-9)    PHQ-2 Score: 0  Alcohol Screen: Not on file  Housing: Low Risk (07/29/2024)   Epic    Unable to Pay for Housing in the Last Year: No    Number of Times Moved in the Last Year: 1    Homeless in the Last Year: No  Utilities: Not At Risk (06/10/2024)   Received from [facility]    In the past 12 months has the electric, gas, oil, or water company threatened to shut off services in your home?: No  Health Literacy: Not on file   Allergies[1] Family History  Problem Relation Age of Onset   Hypertension Mother    Hyperlipidemia Father    Hypertension Father    Lung disease Father    Snoring Father    Colon cancer Neg Hx    Stomach cancer Neg Hx    Esophageal cancer Neg Hx     Current Outpatient Medications (Endocrine & Metabolic):    predniSONE  (DELTASONE ) 20 MG tablet, Take 1 tablet (20 mg total) by mouth daily with breakfast.   predniSONE  (DELTASONE ) 5 MG tablet, Take 0.5 tablets (2.5 mg total) by mouth daily with breakfast.  Current Outpatient Medications (Cardiovascular):    Bempedoic Acid-Ezetimibe (NEXLIZET ) 180-10 MG TABS, Take 1 tablet by mouth daily in the afternoon.   Evolocumab   (REPATHA  SURECLICK) 140 MG/ML SOAJ, Inject 140 mg into the skin every 14 (fourteen) days.   rosuvastatin  (CRESTOR ) 20 MG tablet, Take 1 tablet (20 mg total) by mouth daily.  Current Outpatient Medications (Respiratory):    azelastine  (ASTELIN ) 0.1 % nasal spray, Place 2 sprays into both nostrils 2 (two) times daily.   cetirizine  (ZYRTEC ) 10 MG tablet, Take 1 tablet (10 mg total) by mouth daily.  Current Outpatient Medications (Analgesics):    diclofenac  (VOLTAREN ) 75 MG EC tablet, Take 1 tablet (75 mg total) by mouth 2 (two) times daily.  Current Outpatient Medications (Hematological):    cyanocobalamin  (VITAMIN B12) 1000 MCG tablet, Take 1 tablet by mouth daily.  Current Outpatient Medications (Other):    azelastine  (OPTIVAR ) 0.05 % ophthalmic solution, Place 1 drop into both eyes 2 (two) times daily.   Multiple Vitamins-Minerals (ONCOVITE) TABS, Take 1 capsule by mouth daily.   OVER THE COUNTER MEDICATION, Pt taking vitamin D  1 a day   pantoprazole  (PROTONIX ) 20 MG tablet, TAKE 1 TABLET (20 MG TOTAL) BY MOUTH 2 (TWO) TIMES DAILY BEFORE A MEAL. TAKE 1 PILL TWICE A DAY FOR 2 WEEKS, THEN 1 PILL EVERY MORNING.   Vitamin D , Ergocalciferol , (DRISDOL ) 1.25 MG (50000 UNIT) CAPS capsule, Take 1 capsule (50,000 Units total) by mouth every 7 (seven) days.   Reviewed prior external information including notes and imaging from  primary care provider As well as notes that were available from care everywhere and other healthcare systems.  Past medical history, social, surgical and family history all reviewed in electronic medical record.  No pertanent information unless stated regarding to the chief complaint.   Review of Systems:  No headache, visual changes, nausea, vomiting, diarrhea, constipation, dizziness, abdominal pain, skin rash, fevers, chills, night sweats, weight loss, swollen lymph nodes, body aches, joint swelling, chest pain, shortness of breath, mood changes. POSITIVE muscle  aches  Objective  There were no vitals taken for this visit.   General: No apparent distress alert and oriented x3 mood and affect normal, dressed appropriately.  HEENT: Pupils equal, extraocular movements intact  Respiratory: Patient's speak in full sentences and does not appear short of breath  Cardiovascular: No lower extremity edema, non tender, no erythema  Impression and Recommendations:           [1] No Known Allergies  "

## 2024-10-17 ENCOUNTER — Ambulatory Visit: Admitting: Family Medicine

## 2024-10-19 ENCOUNTER — Other Ambulatory Visit: Payer: Self-pay | Admitting: Family Medicine

## 2024-10-19 ENCOUNTER — Ambulatory Visit: Admitting: Family Medicine

## 2024-10-19 ENCOUNTER — Encounter: Payer: Self-pay | Admitting: Family Medicine

## 2024-10-19 VITALS — BP 126/82 | HR 86 | Temp 97.5°F | Ht 64.0 in | Wt 182.1 lb

## 2024-10-19 DIAGNOSIS — M5412 Radiculopathy, cervical region: Secondary | ICD-10-CM

## 2024-10-19 DIAGNOSIS — E7849 Other hyperlipidemia: Secondary | ICD-10-CM

## 2024-10-19 DIAGNOSIS — R700 Elevated erythrocyte sedimentation rate: Secondary | ICD-10-CM

## 2024-10-19 DIAGNOSIS — R142 Eructation: Secondary | ICD-10-CM | POA: Diagnosis not present

## 2024-10-19 DIAGNOSIS — L659 Nonscarring hair loss, unspecified: Secondary | ICD-10-CM | POA: Diagnosis not present

## 2024-10-19 DIAGNOSIS — R051 Acute cough: Secondary | ICD-10-CM

## 2024-10-19 DIAGNOSIS — E559 Vitamin D deficiency, unspecified: Secondary | ICD-10-CM | POA: Diagnosis not present

## 2024-10-19 LAB — COMPREHENSIVE METABOLIC PANEL WITH GFR
ALT: 18 U/L (ref 3–35)
AST: 21 U/L (ref 5–37)
Albumin: 4.2 g/dL (ref 3.5–5.2)
Alkaline Phosphatase: 86 U/L (ref 39–117)
BUN: 10 mg/dL (ref 6–23)
CO2: 30 meq/L (ref 19–32)
Calcium: 9.4 mg/dL (ref 8.4–10.5)
Chloride: 103 meq/L (ref 96–112)
Creatinine, Ser: 0.72 mg/dL (ref 0.40–1.20)
GFR: 92.55 mL/min
Glucose, Bld: 108 mg/dL — ABNORMAL HIGH (ref 70–99)
Potassium: 4.1 meq/L (ref 3.5–5.1)
Sodium: 138 meq/L (ref 135–145)
Total Bilirubin: 0.4 mg/dL (ref 0.2–1.2)
Total Protein: 7.6 g/dL (ref 6.0–8.3)

## 2024-10-19 LAB — CBC WITH DIFFERENTIAL/PLATELET
Basophils Absolute: 0.1 10*3/uL (ref 0.0–0.1)
Basophils Relative: 2.5 % (ref 0.0–3.0)
Eosinophils Absolute: 0.2 10*3/uL (ref 0.0–0.7)
Eosinophils Relative: 3.9 % (ref 0.0–5.0)
HCT: 37 % (ref 36.0–46.0)
Hemoglobin: 12.5 g/dL (ref 12.0–15.0)
Lymphocytes Relative: 35.1 % (ref 12.0–46.0)
Lymphs Abs: 2.1 10*3/uL (ref 0.7–4.0)
MCHC: 33.8 g/dL (ref 30.0–36.0)
MCV: 81.4 fl (ref 78.0–100.0)
Monocytes Absolute: 0.4 10*3/uL (ref 0.1–1.0)
Monocytes Relative: 7.4 % (ref 3.0–12.0)
Neutro Abs: 3 10*3/uL (ref 1.4–7.7)
Neutrophils Relative %: 51.1 % (ref 43.0–77.0)
Platelets: 379 10*3/uL (ref 150.0–400.0)
RBC: 4.55 Mil/uL (ref 3.87–5.11)
RDW: 12.8 % (ref 11.5–15.5)
WBC: 5.9 10*3/uL (ref 4.0–10.5)

## 2024-10-19 LAB — SEDIMENTATION RATE: Sed Rate: 28 mm/h (ref 0–30)

## 2024-10-19 MED ORDER — TIZANIDINE HCL 4 MG PO TABS: 4.0000 mg | ORAL_TABLET | Freq: Four times a day (QID) | ORAL | 0 refills | Status: DC | PRN

## 2024-10-19 MED ORDER — PREDNISONE 20 MG PO TABS: 60.0000 mg | ORAL_TABLET | Freq: Every day | ORAL | 0 refills | Status: DC

## 2024-10-19 MED ORDER — PANTOPRAZOLE SODIUM 20 MG PO TBEC: 20.0000 mg | DELAYED_RELEASE_TABLET | Freq: Two times a day (BID) | ORAL | 1 refills | Status: AC

## 2024-10-19 MED ORDER — PREDNISONE 20 MG PO TABS: 20.0000 mg | ORAL_TABLET | Freq: Every day | ORAL | 1 refills | Status: DC

## 2024-10-19 NOTE — Patient Instructions (Addendum)
 Restart protonix  twice daily  Mri sch Refer GI  Take 60mg  of prednisone  daily for 5 days, then back to the 20mg  daily

## 2024-10-19 NOTE — Telephone Encounter (Signed)
 Please tell pt to stop famotidine 
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
## 2024-10-20 ENCOUNTER — Ambulatory Visit: Payer: Self-pay | Admitting: Family Medicine

## 2024-10-20 ENCOUNTER — Ambulatory Visit: Admitting: Family Medicine

## 2024-10-20 LAB — T4, FREE: Free T4: 0.75 ng/dL (ref 0.60–1.60)

## 2024-10-20 LAB — TSH: TSH: 2.24 u[IU]/mL (ref 0.35–5.50)

## 2024-10-20 LAB — T3, FREE: T3, Free: 2.9 pg/mL (ref 2.3–4.2)

## 2024-10-20 NOTE — Progress Notes (Signed)
 Labs are stable.  Same dose of prednisone 

## 2024-10-21 NOTE — Progress Notes (Signed)
Pt has read results.

## 2024-11-16 ENCOUNTER — Ambulatory Visit: Admitting: Internal Medicine

## 2024-11-16 ENCOUNTER — Telehealth: Admitting: Neurology

## 2024-11-21 ENCOUNTER — Ambulatory Visit: Admitting: Family Medicine

## 2025-03-06 ENCOUNTER — Ambulatory Visit: Admitting: Internal Medicine
